# Patient Record
Sex: Female | Born: 1950 | Race: White | Hispanic: No | Marital: Married | Smoking: Former smoker
Health system: Southern US, Community
[De-identification: ages and names within clinical notes are randomized; demographics above are authoritative.]

## PROBLEM LIST (undated history)

## (undated) DIAGNOSIS — R519 Headache, unspecified: Secondary | ICD-10-CM

## (undated) DIAGNOSIS — E785 Hyperlipidemia, unspecified: Secondary | ICD-10-CM

## (undated) DIAGNOSIS — F419 Anxiety disorder, unspecified: Secondary | ICD-10-CM

## (undated) DIAGNOSIS — M1990 Unspecified osteoarthritis, unspecified site: Secondary | ICD-10-CM

## (undated) DIAGNOSIS — I10 Essential (primary) hypertension: Secondary | ICD-10-CM

## (undated) DIAGNOSIS — R0600 Dyspnea, unspecified: Secondary | ICD-10-CM

## (undated) DIAGNOSIS — J449 Chronic obstructive pulmonary disease, unspecified: Secondary | ICD-10-CM

## (undated) DIAGNOSIS — T8859XA Other complications of anesthesia, initial encounter: Secondary | ICD-10-CM

## (undated) DIAGNOSIS — K219 Gastro-esophageal reflux disease without esophagitis: Secondary | ICD-10-CM

## (undated) HISTORY — PX: DILATION AND CURETTAGE OF UTERUS: SHX78

---

## 2010-08-06 ENCOUNTER — Observation Stay (HOSPITAL_COMMUNITY)
Admission: EM | Admit: 2010-08-06 | Discharge: 2010-08-07 | Payer: Self-pay | Source: Home / Self Care | Admitting: Emergency Medicine

## 2010-11-17 LAB — BASIC METABOLIC PANEL
CO2: 24 mEq/L (ref 19–32)
Chloride: 107 mEq/L (ref 96–112)
Glucose, Bld: 128 mg/dL — ABNORMAL HIGH (ref 70–99)
Potassium: 3.8 mEq/L (ref 3.5–5.1)
Sodium: 140 mEq/L (ref 135–145)

## 2010-11-17 LAB — COMPREHENSIVE METABOLIC PANEL
Albumin: 3.9 g/dL (ref 3.5–5.2)
BUN: 17 mg/dL (ref 6–23)
CO2: 21 mEq/L (ref 19–32)
Chloride: 109 mEq/L (ref 96–112)
Creatinine, Ser: 1.15 mg/dL (ref 0.4–1.2)
GFR calc non Af Amer: 48 mL/min — ABNORMAL LOW (ref >60)
Total Bilirubin: 0.3 mg/dL (ref 0.3–1.2)

## 2010-11-17 LAB — LIPID PANEL
Triglycerides: 294 mg/dL — ABNORMAL HIGH (ref <150)
VLDL: 59 mg/dL — ABNORMAL HIGH (ref 0–40)

## 2010-11-17 LAB — DIFFERENTIAL
Basophils Absolute: 0 10*3/uL (ref 0.0–0.1)
Basophils Relative: 0 % (ref 0–1)
Monocytes Absolute: 0.9 10*3/uL (ref 0.1–1.0)
Monocytes Relative: 9 % (ref 3–12)
Neutro Abs: 6.9 10*3/uL (ref 1.7–7.7)

## 2010-11-17 LAB — CBC
HCT: 39.9 % (ref 36.0–46.0)
Hemoglobin: 13.9 g/dL (ref 12.0–15.0)
MCH: 33.4 pg (ref 26.0–34.0)
MCHC: 34.8 g/dL (ref 30.0–36.0)
MCV: 95.9 fL (ref 78.0–100.0)

## 2010-11-17 LAB — CARDIAC PANEL(CRET KIN+CKTOT+MB+TROPI)
CK, MB: 1.3 ng/mL (ref 0.3–4.0)
Relative Index: INVALID (ref 0.0–2.5)
Troponin I: <0.01 ng/mL (ref 0.00–0.06)
Troponin I: <0.01 ng/mL (ref 0.00–0.06)

## 2010-11-17 LAB — TSH: TSH: 0.512 u[IU]/mL (ref 0.350–4.500)

## 2012-07-25 DIAGNOSIS — M6530 Trigger finger, unspecified finger: Secondary | ICD-10-CM | POA: Insufficient documentation

## 2013-05-22 DIAGNOSIS — M722 Plantar fascial fibromatosis: Secondary | ICD-10-CM | POA: Insufficient documentation

## 2014-02-19 DIAGNOSIS — E7800 Pure hypercholesterolemia, unspecified: Secondary | ICD-10-CM | POA: Insufficient documentation

## 2014-03-01 DIAGNOSIS — M1990 Unspecified osteoarthritis, unspecified site: Secondary | ICD-10-CM | POA: Insufficient documentation

## 2014-03-01 DIAGNOSIS — R7309 Other abnormal glucose: Secondary | ICD-10-CM | POA: Insufficient documentation

## 2014-03-01 DIAGNOSIS — F321 Major depressive disorder, single episode, moderate: Secondary | ICD-10-CM | POA: Insufficient documentation

## 2014-04-17 DIAGNOSIS — M545 Low back pain, unspecified: Secondary | ICD-10-CM | POA: Insufficient documentation

## 2015-01-29 DIAGNOSIS — Z6831 Body mass index (BMI) 31.0-31.9, adult: Secondary | ICD-10-CM | POA: Diagnosis not present

## 2015-01-29 DIAGNOSIS — S80812A Abrasion, left lower leg, initial encounter: Secondary | ICD-10-CM | POA: Diagnosis not present

## 2015-02-28 DIAGNOSIS — R7309 Other abnormal glucose: Secondary | ICD-10-CM | POA: Diagnosis not present

## 2015-02-28 DIAGNOSIS — E780 Pure hypercholesterolemia: Secondary | ICD-10-CM | POA: Diagnosis not present

## 2015-02-28 DIAGNOSIS — F321 Major depressive disorder, single episode, moderate: Secondary | ICD-10-CM | POA: Diagnosis not present

## 2015-02-28 DIAGNOSIS — N189 Chronic kidney disease, unspecified: Secondary | ICD-10-CM | POA: Diagnosis not present

## 2015-03-12 DIAGNOSIS — F411 Generalized anxiety disorder: Secondary | ICD-10-CM | POA: Diagnosis not present

## 2015-03-12 DIAGNOSIS — N183 Chronic kidney disease, stage 3 (moderate): Secondary | ICD-10-CM | POA: Diagnosis not present

## 2015-03-12 DIAGNOSIS — E780 Pure hypercholesterolemia: Secondary | ICD-10-CM | POA: Diagnosis not present

## 2015-03-12 DIAGNOSIS — F321 Major depressive disorder, single episode, moderate: Secondary | ICD-10-CM | POA: Diagnosis not present

## 2015-03-12 DIAGNOSIS — Z6831 Body mass index (BMI) 31.0-31.9, adult: Secondary | ICD-10-CM | POA: Diagnosis not present

## 2015-03-12 DIAGNOSIS — M722 Plantar fascial fibromatosis: Secondary | ICD-10-CM | POA: Diagnosis not present

## 2015-03-12 DIAGNOSIS — M1990 Unspecified osteoarthritis, unspecified site: Secondary | ICD-10-CM | POA: Diagnosis not present

## 2015-03-12 DIAGNOSIS — R7309 Other abnormal glucose: Secondary | ICD-10-CM | POA: Diagnosis not present

## 2015-03-17 DIAGNOSIS — M7062 Trochanteric bursitis, left hip: Secondary | ICD-10-CM | POA: Insufficient documentation

## 2015-03-18 DIAGNOSIS — M7062 Trochanteric bursitis, left hip: Secondary | ICD-10-CM | POA: Diagnosis not present

## 2015-07-09 DIAGNOSIS — R7309 Other abnormal glucose: Secondary | ICD-10-CM | POA: Diagnosis not present

## 2015-07-09 DIAGNOSIS — E7800 Pure hypercholesterolemia, unspecified: Secondary | ICD-10-CM | POA: Diagnosis not present

## 2015-07-10 DIAGNOSIS — E7800 Pure hypercholesterolemia, unspecified: Secondary | ICD-10-CM | POA: Diagnosis not present

## 2015-07-16 DIAGNOSIS — Z23 Encounter for immunization: Secondary | ICD-10-CM | POA: Diagnosis not present

## 2015-07-16 DIAGNOSIS — M1990 Unspecified osteoarthritis, unspecified site: Secondary | ICD-10-CM | POA: Diagnosis not present

## 2015-07-16 DIAGNOSIS — R7309 Other abnormal glucose: Secondary | ICD-10-CM | POA: Diagnosis not present

## 2015-07-16 DIAGNOSIS — M722 Plantar fascial fibromatosis: Secondary | ICD-10-CM | POA: Diagnosis not present

## 2015-07-16 DIAGNOSIS — F321 Major depressive disorder, single episode, moderate: Secondary | ICD-10-CM | POA: Diagnosis not present

## 2015-07-16 DIAGNOSIS — N183 Chronic kidney disease, stage 3 (moderate): Secondary | ICD-10-CM | POA: Diagnosis not present

## 2015-07-16 DIAGNOSIS — E7800 Pure hypercholesterolemia, unspecified: Secondary | ICD-10-CM | POA: Diagnosis not present

## 2015-07-16 DIAGNOSIS — F411 Generalized anxiety disorder: Secondary | ICD-10-CM | POA: Diagnosis not present

## 2015-08-12 DIAGNOSIS — Z1231 Encounter for screening mammogram for malignant neoplasm of breast: Secondary | ICD-10-CM | POA: Diagnosis not present

## 2015-08-13 DIAGNOSIS — Z7982 Long term (current) use of aspirin: Secondary | ICD-10-CM | POA: Diagnosis not present

## 2015-08-13 DIAGNOSIS — I10 Essential (primary) hypertension: Secondary | ICD-10-CM | POA: Diagnosis not present

## 2015-08-13 DIAGNOSIS — F17200 Nicotine dependence, unspecified, uncomplicated: Secondary | ICD-10-CM | POA: Diagnosis not present

## 2015-08-13 DIAGNOSIS — F418 Other specified anxiety disorders: Secondary | ICD-10-CM | POA: Diagnosis not present

## 2015-08-13 DIAGNOSIS — M1990 Unspecified osteoarthritis, unspecified site: Secondary | ICD-10-CM | POA: Diagnosis not present

## 2015-08-13 DIAGNOSIS — Z79899 Other long term (current) drug therapy: Secondary | ICD-10-CM | POA: Diagnosis not present

## 2015-08-13 DIAGNOSIS — Z13820 Encounter for screening for osteoporosis: Secondary | ICD-10-CM | POA: Diagnosis not present

## 2015-08-13 DIAGNOSIS — M8580 Other specified disorders of bone density and structure, unspecified site: Secondary | ICD-10-CM | POA: Diagnosis not present

## 2015-08-13 DIAGNOSIS — E7800 Pure hypercholesterolemia, unspecified: Secondary | ICD-10-CM | POA: Diagnosis not present

## 2015-08-13 DIAGNOSIS — Z780 Asymptomatic menopausal state: Secondary | ICD-10-CM | POA: Diagnosis not present

## 2015-11-05 DIAGNOSIS — E7800 Pure hypercholesterolemia, unspecified: Secondary | ICD-10-CM | POA: Diagnosis not present

## 2015-11-05 DIAGNOSIS — N183 Chronic kidney disease, stage 3 (moderate): Secondary | ICD-10-CM | POA: Diagnosis not present

## 2015-11-05 DIAGNOSIS — F321 Major depressive disorder, single episode, moderate: Secondary | ICD-10-CM | POA: Diagnosis not present

## 2015-11-05 DIAGNOSIS — R7309 Other abnormal glucose: Secondary | ICD-10-CM | POA: Diagnosis not present

## 2015-11-12 DIAGNOSIS — E7800 Pure hypercholesterolemia, unspecified: Secondary | ICD-10-CM | POA: Diagnosis not present

## 2015-11-12 DIAGNOSIS — Z6831 Body mass index (BMI) 31.0-31.9, adult: Secondary | ICD-10-CM | POA: Diagnosis not present

## 2015-11-12 DIAGNOSIS — R7309 Other abnormal glucose: Secondary | ICD-10-CM | POA: Diagnosis not present

## 2015-11-12 DIAGNOSIS — F0390 Unspecified dementia without behavioral disturbance: Secondary | ICD-10-CM | POA: Diagnosis not present

## 2015-11-12 DIAGNOSIS — N183 Chronic kidney disease, stage 3 (moderate): Secondary | ICD-10-CM | POA: Diagnosis not present

## 2015-11-12 DIAGNOSIS — F321 Major depressive disorder, single episode, moderate: Secondary | ICD-10-CM | POA: Diagnosis not present

## 2015-11-12 DIAGNOSIS — M1990 Unspecified osteoarthritis, unspecified site: Secondary | ICD-10-CM | POA: Diagnosis not present

## 2015-11-12 DIAGNOSIS — F411 Generalized anxiety disorder: Secondary | ICD-10-CM | POA: Diagnosis not present

## 2015-11-12 DIAGNOSIS — M722 Plantar fascial fibromatosis: Secondary | ICD-10-CM | POA: Diagnosis not present

## 2016-03-23 DIAGNOSIS — R7309 Other abnormal glucose: Secondary | ICD-10-CM | POA: Diagnosis not present

## 2016-03-23 DIAGNOSIS — E7800 Pure hypercholesterolemia, unspecified: Secondary | ICD-10-CM | POA: Diagnosis not present

## 2016-03-23 DIAGNOSIS — N183 Chronic kidney disease, stage 3 (moderate): Secondary | ICD-10-CM | POA: Diagnosis not present

## 2016-03-23 DIAGNOSIS — M1990 Unspecified osteoarthritis, unspecified site: Secondary | ICD-10-CM | POA: Diagnosis not present

## 2016-03-26 DIAGNOSIS — R7309 Other abnormal glucose: Secondary | ICD-10-CM | POA: Diagnosis not present

## 2016-03-26 DIAGNOSIS — M722 Plantar fascial fibromatosis: Secondary | ICD-10-CM | POA: Diagnosis not present

## 2016-03-26 DIAGNOSIS — Z6831 Body mass index (BMI) 31.0-31.9, adult: Secondary | ICD-10-CM | POA: Diagnosis not present

## 2016-03-26 DIAGNOSIS — F411 Generalized anxiety disorder: Secondary | ICD-10-CM | POA: Diagnosis not present

## 2016-03-26 DIAGNOSIS — F321 Major depressive disorder, single episode, moderate: Secondary | ICD-10-CM | POA: Diagnosis not present

## 2016-03-26 DIAGNOSIS — M1990 Unspecified osteoarthritis, unspecified site: Secondary | ICD-10-CM | POA: Diagnosis not present

## 2016-03-26 DIAGNOSIS — Z13220 Encounter for screening for lipoid disorders: Secondary | ICD-10-CM | POA: Diagnosis not present

## 2016-03-26 DIAGNOSIS — Z6832 Body mass index (BMI) 32.0-32.9, adult: Secondary | ICD-10-CM | POA: Diagnosis not present

## 2016-04-29 ENCOUNTER — Emergency Department (HOSPITAL_COMMUNITY): Payer: Commercial Managed Care - HMO

## 2016-04-29 ENCOUNTER — Emergency Department (HOSPITAL_COMMUNITY)
Admission: EM | Admit: 2016-04-29 | Discharge: 2016-04-29 | Disposition: A | Discharge status: Home / Self Care | Payer: Commercial Managed Care - HMO | Attending: Emergency Medicine | Admitting: Emergency Medicine

## 2016-04-29 ENCOUNTER — Encounter (HOSPITAL_COMMUNITY): Payer: Self-pay | Admitting: *Deleted

## 2016-04-29 DIAGNOSIS — Y999 Unspecified external cause status: Secondary | ICD-10-CM | POA: Diagnosis not present

## 2016-04-29 DIAGNOSIS — Z79899 Other long term (current) drug therapy: Secondary | ICD-10-CM | POA: Diagnosis not present

## 2016-04-29 DIAGNOSIS — W298XXA Contact with other powered powered hand tools and household machinery, initial encounter: Secondary | ICD-10-CM | POA: Insufficient documentation

## 2016-04-29 DIAGNOSIS — Y93E5 Activity, floor mopping and cleaning: Secondary | ICD-10-CM | POA: Diagnosis not present

## 2016-04-29 DIAGNOSIS — S46912A Strain of unspecified muscle, fascia and tendon at shoulder and upper arm level, left arm, initial encounter: Secondary | ICD-10-CM | POA: Diagnosis not present

## 2016-04-29 DIAGNOSIS — I10 Essential (primary) hypertension: Secondary | ICD-10-CM | POA: Diagnosis not present

## 2016-04-29 DIAGNOSIS — Y9289 Other specified places as the place of occurrence of the external cause: Secondary | ICD-10-CM | POA: Insufficient documentation

## 2016-04-29 DIAGNOSIS — M25512 Pain in left shoulder: Secondary | ICD-10-CM | POA: Diagnosis not present

## 2016-04-29 DIAGNOSIS — F17210 Nicotine dependence, cigarettes, uncomplicated: Secondary | ICD-10-CM | POA: Insufficient documentation

## 2016-04-29 HISTORY — DX: Hyperlipidemia, unspecified: E78.5

## 2016-04-29 HISTORY — DX: Essential (primary) hypertension: I10

## 2016-04-29 HISTORY — DX: Unspecified osteoarthritis, unspecified site: M19.90

## 2016-04-29 MED ORDER — OXYCODONE-ACETAMINOPHEN 5-325 MG PO TABS
1.0000 | ORAL_TABLET | Freq: Once | ORAL | Status: AC
  Administered 2016-04-29: 1 via ORAL
  Filled 2016-04-29: qty 1

## 2016-04-29 MED ORDER — DICLOFENAC SODIUM 75 MG PO TBEC: 75.0000 mg | DELAYED_RELEASE_TABLET | Freq: Two times a day (BID) | ORAL | 0 refills | Status: DC

## 2016-04-29 NOTE — ED Provider Notes (Signed)
[facility]-EMERGENCY DEPT Provider Note   CSN: [id number] Arrival date & time: 04/29/16  0846     History   Chief Complaint Chief Complaint  Patient presents with  . Shoulder Pain    HPI Suzanne Blake is a 65 y.o. female.  HPI   Suzanne Blake is a 65 y.o. female who presents to the Emergency Department complaining of left shoulder pain for 6 days. She states the pain has been gradually increasing and onset was after using a carpet shampooer.  She describes a sharp, constant pain in the back of her shoulder that is worse with movement.  She normally takes Tramadol daily for OA and states the medication is not helping.  She also has intermittent pain radiating into her left arm to the level of her elbow.  She denies swelling, discoloration, numbness, chest pain or shortness of breath.     Past Medical History:  Diagnosis Date  . Arthritis   . Hyperlipidemia   . Hypertension     There are no active problems to display for this patient.   Past Surgical History:  Procedure Laterality Date  . DILATION AND CURETTAGE OF UTERUS      OB History    No data available       Home Medications    Prior to Admission medications   Not on File    Family History No family history on file.  Social History Social History  Substance Use Topics  . Smoking status: Current Every Day Smoker    Packs/day: 1.00    Types: Cigarettes  . Smokeless tobacco: Never Used  . Alcohol use No     Allergies   Lisinopril   Review of Systems Review of Systems  Constitutional: Negative for chills and fever.  Respiratory: Negative for shortness of breath.   Cardiovascular: Negative for chest pain.  Gastrointestinal: Negative for nausea and vomiting.  Musculoskeletal: Positive for arthralgias (Left shoulder pain). Negative for joint swelling, neck pain and neck stiffness.  Skin: Negative for color change and wound.  Neurological: Negative for weakness and numbness.  All other systems reviewed  and are negative.    Physical Exam Updated Vital Signs BP (!) 189/102 (BP Location: Right Arm) Comment: taken twice for validation  Pulse 66   Temp 98.7 F (37.1 C) (Oral)   Resp 16   SpO2 100%   Physical Exam  Constitutional: She is oriented to person, place, and time. She appears well-developed and well-nourished. No distress.  HENT:  Head: Normocephalic and atraumatic.  Neck: Normal range of motion. Neck supple. No thyromegaly present.  Cardiovascular: Normal rate, regular rhythm and intact distal pulses.   No murmur heard. Pulmonary/Chest: Effort normal and breath sounds normal. No respiratory distress. She exhibits no tenderness.  Musculoskeletal: She exhibits tenderness. She exhibits no edema.  Tender to palpation along the left scapular border, and left trapezius muscles.  Radial pulse is brisk, distal sensation intact, CR< 2 sec. Grip strength is strong and symmetrical.   No abrasions, edema , erythema or step-off deformity of the joint.   Lymphadenopathy:    She has no cervical adenopathy.  Neurological: She is alert and oriented to person, place, and time. She has normal strength. No sensory deficit. She exhibits normal muscle tone. Coordination normal.  Reflex Scores:      Tricep reflexes are 1+ on the right side and 2+ on the left side.      Bicep reflexes are 1+ on the right side and 2+ on the  left side. Skin: Skin is warm and dry.  Nursing note and vitals reviewed.    ED Treatments / Results  Labs (all labs ordered are listed, but only abnormal results are displayed) Labs Reviewed - No data to display  EKG  EKG Interpretation None       Radiology Dg Shoulder Left  Result Date: 04/29/2016 CLINICAL DATA:  Left shoulder pain starting Saturday after cleaning carpets EXAM: LEFT SHOULDER - 2+ VIEW COMPARISON:  None. FINDINGS: Three views of the left shoulder submitted. Minimal inferior spurring of left glenoid. Moderate degenerative changes AC joint. No acute  fracture or subluxation. IMPRESSION: No acute fracture or subluxation.  Degenerative changes. Electronically Signed   By: Natasha MeadLiviu  Pop M.D.   On: 04/29/2016 09:57    Procedures Procedures (including critical care time)  Medications Ordered in ED Medications  oxyCODONE-acetaminophen (PERCOCET/ROXICET) 5-325 MG per tablet 1 tablet (not administered)     Initial Impression / Assessment and Plan / ED Course  I have reviewed the triage vital signs and the nursing notes.  Pertinent labs & imaging results that were available during my care of the patient were reviewed by me and considered in my medical decision making (see chart for details).  Clinical Course   X-rays negative for fracture, patient has full range of motion of the joint and pain reproduced with abduction. Neurovascularly intact. Symptoms are likely musculoskeletal. Doubt cardiac process. Patient agrees to symptomatic treatment and orthopedic follow-up if not improving.  States she has taken diclofenac previously with improvement of previous joint pains.  I have advised very limited use and to take with food.  She verbalized understanding and agrees to plan   Final Clinical Impressions(s) / ED Diagnoses   Final diagnoses:  Left shoulder strain, initial encounter    New Prescriptions New Prescriptions   No medications on file     Rosey Bathammy Veda Arrellano, PA-C 04/29/16 1107    Rolland PorterMark James, MD 05/04/16 1715

## 2016-04-29 NOTE — Discharge Instructions (Signed)
Alternate ice and heat to your shoulder.  Follow-up with our primary doctor or contact the orthopedic doctor listed to arrange a follow-up appt.  Be sure to take the diclofenac with food and only take it for few days.

## 2016-04-29 NOTE — ED Notes (Signed)
PA at bedside.

## 2016-04-29 NOTE — ED Triage Notes (Signed)
Pt comes in for left shoulder pain. This started last Friday after she cleaned her carpet. Pt states pain got worse at 3am today. She states the pain is in her left shoulder, left back, and slightly into her back.  Pain is worse with movement, denies any chest pain.

## 2016-07-27 DIAGNOSIS — R7309 Other abnormal glucose: Secondary | ICD-10-CM | POA: Diagnosis not present

## 2016-07-27 DIAGNOSIS — N183 Chronic kidney disease, stage 3 (moderate): Secondary | ICD-10-CM | POA: Diagnosis not present

## 2016-07-27 DIAGNOSIS — E7800 Pure hypercholesterolemia, unspecified: Secondary | ICD-10-CM | POA: Diagnosis not present

## 2016-07-30 DIAGNOSIS — Z23 Encounter for immunization: Secondary | ICD-10-CM | POA: Diagnosis not present

## 2016-07-30 DIAGNOSIS — F411 Generalized anxiety disorder: Secondary | ICD-10-CM | POA: Diagnosis not present

## 2016-07-30 DIAGNOSIS — M722 Plantar fascial fibromatosis: Secondary | ICD-10-CM | POA: Diagnosis not present

## 2016-07-30 DIAGNOSIS — E7800 Pure hypercholesterolemia, unspecified: Secondary | ICD-10-CM | POA: Diagnosis not present

## 2016-07-30 DIAGNOSIS — M1990 Unspecified osteoarthritis, unspecified site: Secondary | ICD-10-CM | POA: Diagnosis not present

## 2016-07-30 DIAGNOSIS — N183 Chronic kidney disease, stage 3 (moderate): Secondary | ICD-10-CM | POA: Diagnosis not present

## 2016-07-30 DIAGNOSIS — F321 Major depressive disorder, single episode, moderate: Secondary | ICD-10-CM | POA: Diagnosis not present

## 2016-07-30 DIAGNOSIS — R7309 Other abnormal glucose: Secondary | ICD-10-CM | POA: Diagnosis not present

## 2016-08-13 DIAGNOSIS — R51 Headache: Secondary | ICD-10-CM | POA: Diagnosis not present

## 2016-08-13 DIAGNOSIS — Z8489 Family history of other specified conditions: Secondary | ICD-10-CM | POA: Diagnosis not present

## 2016-08-13 DIAGNOSIS — R930 Abnormal findings on diagnostic imaging of skull and head, not elsewhere classified: Secondary | ICD-10-CM | POA: Diagnosis not present

## 2016-08-13 DIAGNOSIS — R413 Other amnesia: Secondary | ICD-10-CM | POA: Diagnosis not present

## 2016-11-22 DIAGNOSIS — F321 Major depressive disorder, single episode, moderate: Secondary | ICD-10-CM | POA: Diagnosis not present

## 2016-11-22 DIAGNOSIS — E7800 Pure hypercholesterolemia, unspecified: Secondary | ICD-10-CM | POA: Diagnosis not present

## 2016-11-22 DIAGNOSIS — N183 Chronic kidney disease, stage 3 (moderate): Secondary | ICD-10-CM | POA: Diagnosis not present

## 2016-11-22 DIAGNOSIS — R7309 Other abnormal glucose: Secondary | ICD-10-CM | POA: Diagnosis not present

## 2016-11-22 DIAGNOSIS — F0390 Unspecified dementia without behavioral disturbance: Secondary | ICD-10-CM | POA: Diagnosis not present

## 2016-11-22 DIAGNOSIS — F411 Generalized anxiety disorder: Secondary | ICD-10-CM | POA: Diagnosis not present

## 2016-11-22 DIAGNOSIS — N189 Chronic kidney disease, unspecified: Secondary | ICD-10-CM | POA: Diagnosis not present

## 2016-11-22 DIAGNOSIS — M1990 Unspecified osteoarthritis, unspecified site: Secondary | ICD-10-CM | POA: Diagnosis not present

## 2016-11-23 DIAGNOSIS — F03A0 Unspecified dementia, mild, without behavioral disturbance, psychotic disturbance, mood disturbance, and anxiety: Secondary | ICD-10-CM | POA: Insufficient documentation

## 2016-11-23 DIAGNOSIS — N183 Chronic kidney disease, stage 3 unspecified: Secondary | ICD-10-CM | POA: Insufficient documentation

## 2016-11-24 DIAGNOSIS — M1990 Unspecified osteoarthritis, unspecified site: Secondary | ICD-10-CM | POA: Diagnosis not present

## 2016-11-24 DIAGNOSIS — N183 Chronic kidney disease, stage 3 (moderate): Secondary | ICD-10-CM | POA: Diagnosis not present

## 2016-11-24 DIAGNOSIS — Z1389 Encounter for screening for other disorder: Secondary | ICD-10-CM | POA: Diagnosis not present

## 2016-11-24 DIAGNOSIS — E7800 Pure hypercholesterolemia, unspecified: Secondary | ICD-10-CM | POA: Diagnosis not present

## 2016-11-24 DIAGNOSIS — M722 Plantar fascial fibromatosis: Secondary | ICD-10-CM | POA: Diagnosis not present

## 2016-11-24 DIAGNOSIS — R7309 Other abnormal glucose: Secondary | ICD-10-CM | POA: Diagnosis not present

## 2016-11-24 DIAGNOSIS — F411 Generalized anxiety disorder: Secondary | ICD-10-CM | POA: Diagnosis not present

## 2016-11-24 DIAGNOSIS — F321 Major depressive disorder, single episode, moderate: Secondary | ICD-10-CM | POA: Diagnosis not present

## 2016-11-30 DIAGNOSIS — Z1231 Encounter for screening mammogram for malignant neoplasm of breast: Secondary | ICD-10-CM | POA: Diagnosis not present

## 2017-03-21 DIAGNOSIS — E7800 Pure hypercholesterolemia, unspecified: Secondary | ICD-10-CM | POA: Diagnosis not present

## 2017-03-21 DIAGNOSIS — F321 Major depressive disorder, single episode, moderate: Secondary | ICD-10-CM | POA: Diagnosis not present

## 2017-03-21 DIAGNOSIS — Z720 Tobacco use: Secondary | ICD-10-CM | POA: Diagnosis not present

## 2017-03-21 DIAGNOSIS — N183 Chronic kidney disease, stage 3 (moderate): Secondary | ICD-10-CM | POA: Diagnosis not present

## 2017-03-21 DIAGNOSIS — Z6831 Body mass index (BMI) 31.0-31.9, adult: Secondary | ICD-10-CM | POA: Diagnosis not present

## 2017-03-21 DIAGNOSIS — R739 Hyperglycemia, unspecified: Secondary | ICD-10-CM | POA: Diagnosis not present

## 2017-03-21 DIAGNOSIS — Z13220 Encounter for screening for lipoid disorders: Secondary | ICD-10-CM | POA: Diagnosis not present

## 2017-03-21 DIAGNOSIS — Z0000 Encounter for general adult medical examination without abnormal findings: Secondary | ICD-10-CM | POA: Diagnosis not present

## 2017-03-21 DIAGNOSIS — F0390 Unspecified dementia without behavioral disturbance: Secondary | ICD-10-CM | POA: Diagnosis not present

## 2017-03-23 DIAGNOSIS — E7800 Pure hypercholesterolemia, unspecified: Secondary | ICD-10-CM | POA: Diagnosis not present

## 2017-03-23 DIAGNOSIS — Z23 Encounter for immunization: Secondary | ICD-10-CM | POA: Diagnosis not present

## 2017-03-23 DIAGNOSIS — Z1212 Encounter for screening for malignant neoplasm of rectum: Secondary | ICD-10-CM | POA: Diagnosis not present

## 2017-03-23 DIAGNOSIS — R7309 Other abnormal glucose: Secondary | ICD-10-CM | POA: Diagnosis not present

## 2017-03-23 DIAGNOSIS — N183 Chronic kidney disease, stage 3 (moderate): Secondary | ICD-10-CM | POA: Diagnosis not present

## 2017-03-23 DIAGNOSIS — Z6831 Body mass index (BMI) 31.0-31.9, adult: Secondary | ICD-10-CM | POA: Diagnosis not present

## 2017-03-23 DIAGNOSIS — M722 Plantar fascial fibromatosis: Secondary | ICD-10-CM | POA: Diagnosis not present

## 2017-03-23 DIAGNOSIS — F0390 Unspecified dementia without behavioral disturbance: Secondary | ICD-10-CM | POA: Diagnosis not present

## 2017-07-22 DIAGNOSIS — R7309 Other abnormal glucose: Secondary | ICD-10-CM | POA: Diagnosis not present

## 2017-07-22 DIAGNOSIS — F321 Major depressive disorder, single episode, moderate: Secondary | ICD-10-CM | POA: Diagnosis not present

## 2017-07-22 DIAGNOSIS — M1990 Unspecified osteoarthritis, unspecified site: Secondary | ICD-10-CM | POA: Diagnosis not present

## 2017-07-22 DIAGNOSIS — N183 Chronic kidney disease, stage 3 (moderate): Secondary | ICD-10-CM | POA: Diagnosis not present

## 2017-07-22 DIAGNOSIS — E7800 Pure hypercholesterolemia, unspecified: Secondary | ICD-10-CM | POA: Diagnosis not present

## 2017-07-26 DIAGNOSIS — Z1212 Encounter for screening for malignant neoplasm of rectum: Secondary | ICD-10-CM | POA: Diagnosis not present

## 2017-07-26 DIAGNOSIS — Z23 Encounter for immunization: Secondary | ICD-10-CM | POA: Diagnosis not present

## 2017-07-26 DIAGNOSIS — F0390 Unspecified dementia without behavioral disturbance: Secondary | ICD-10-CM | POA: Diagnosis not present

## 2017-07-26 DIAGNOSIS — M1990 Unspecified osteoarthritis, unspecified site: Secondary | ICD-10-CM | POA: Diagnosis not present

## 2017-07-26 DIAGNOSIS — R7309 Other abnormal glucose: Secondary | ICD-10-CM | POA: Diagnosis not present

## 2017-07-26 DIAGNOSIS — N183 Chronic kidney disease, stage 3 (moderate): Secondary | ICD-10-CM | POA: Diagnosis not present

## 2017-07-26 DIAGNOSIS — F321 Major depressive disorder, single episode, moderate: Secondary | ICD-10-CM | POA: Diagnosis not present

## 2017-07-26 DIAGNOSIS — E7800 Pure hypercholesterolemia, unspecified: Secondary | ICD-10-CM | POA: Diagnosis not present

## 2017-07-26 DIAGNOSIS — F411 Generalized anxiety disorder: Secondary | ICD-10-CM | POA: Diagnosis not present

## 2017-07-26 DIAGNOSIS — M722 Plantar fascial fibromatosis: Secondary | ICD-10-CM | POA: Diagnosis not present

## 2017-07-26 DIAGNOSIS — Z6831 Body mass index (BMI) 31.0-31.9, adult: Secondary | ICD-10-CM | POA: Diagnosis not present

## 2017-09-04 IMAGING — DX DG SHOULDER 2+V*L*
3 series · 3 of 3 positions shown · non-contrast
Comparison: None.

CLINICAL DATA: Left shoulder pain starting [REDACTED] after cleaning
carpets

EXAM:
LEFT SHOULDER - 2+ VIEW

[shoulder grashey]
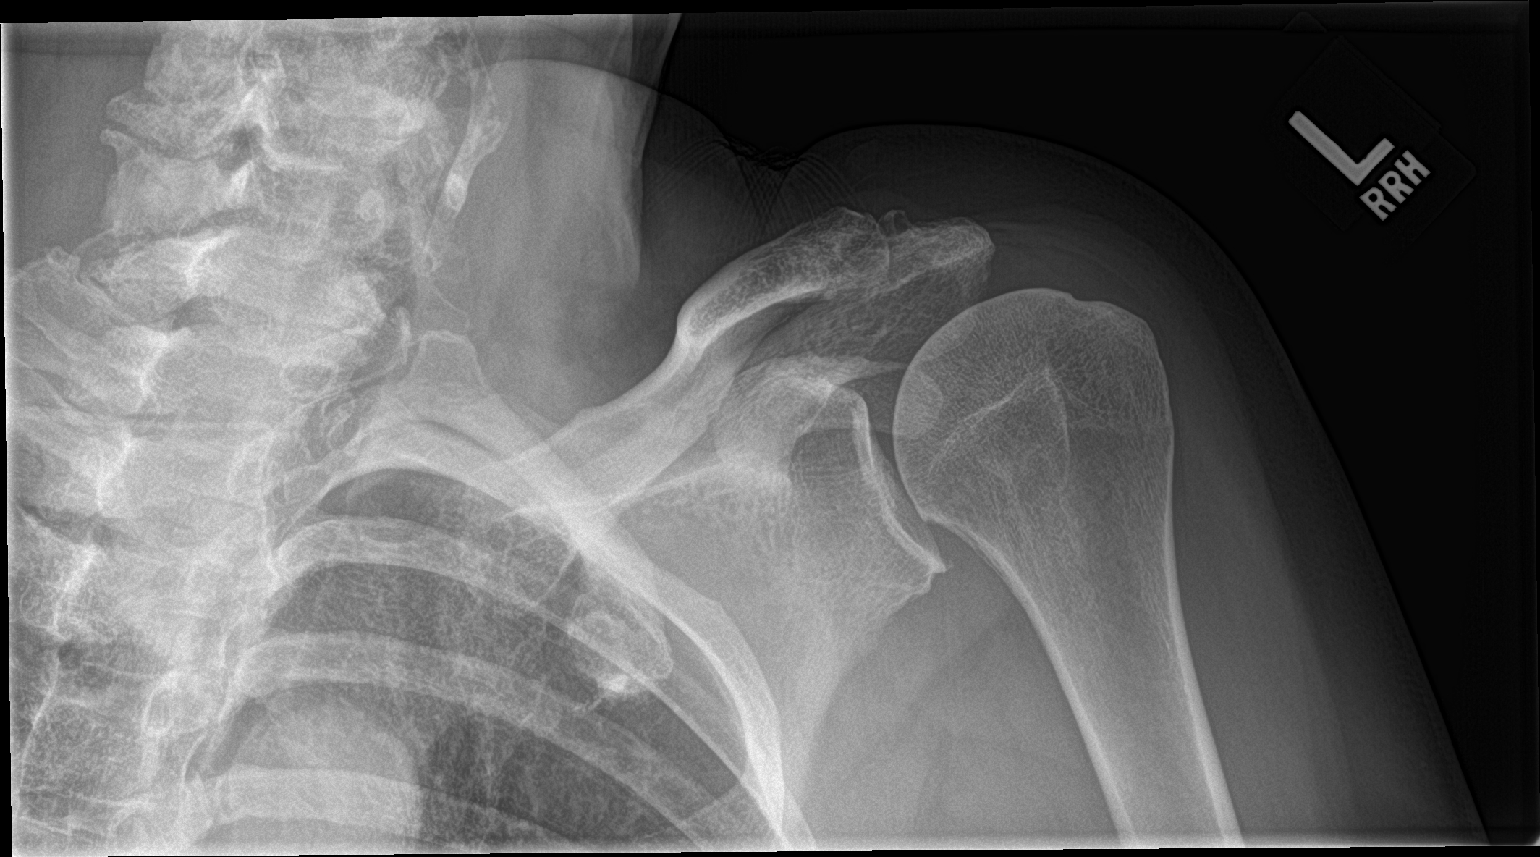

[shoulder y view]
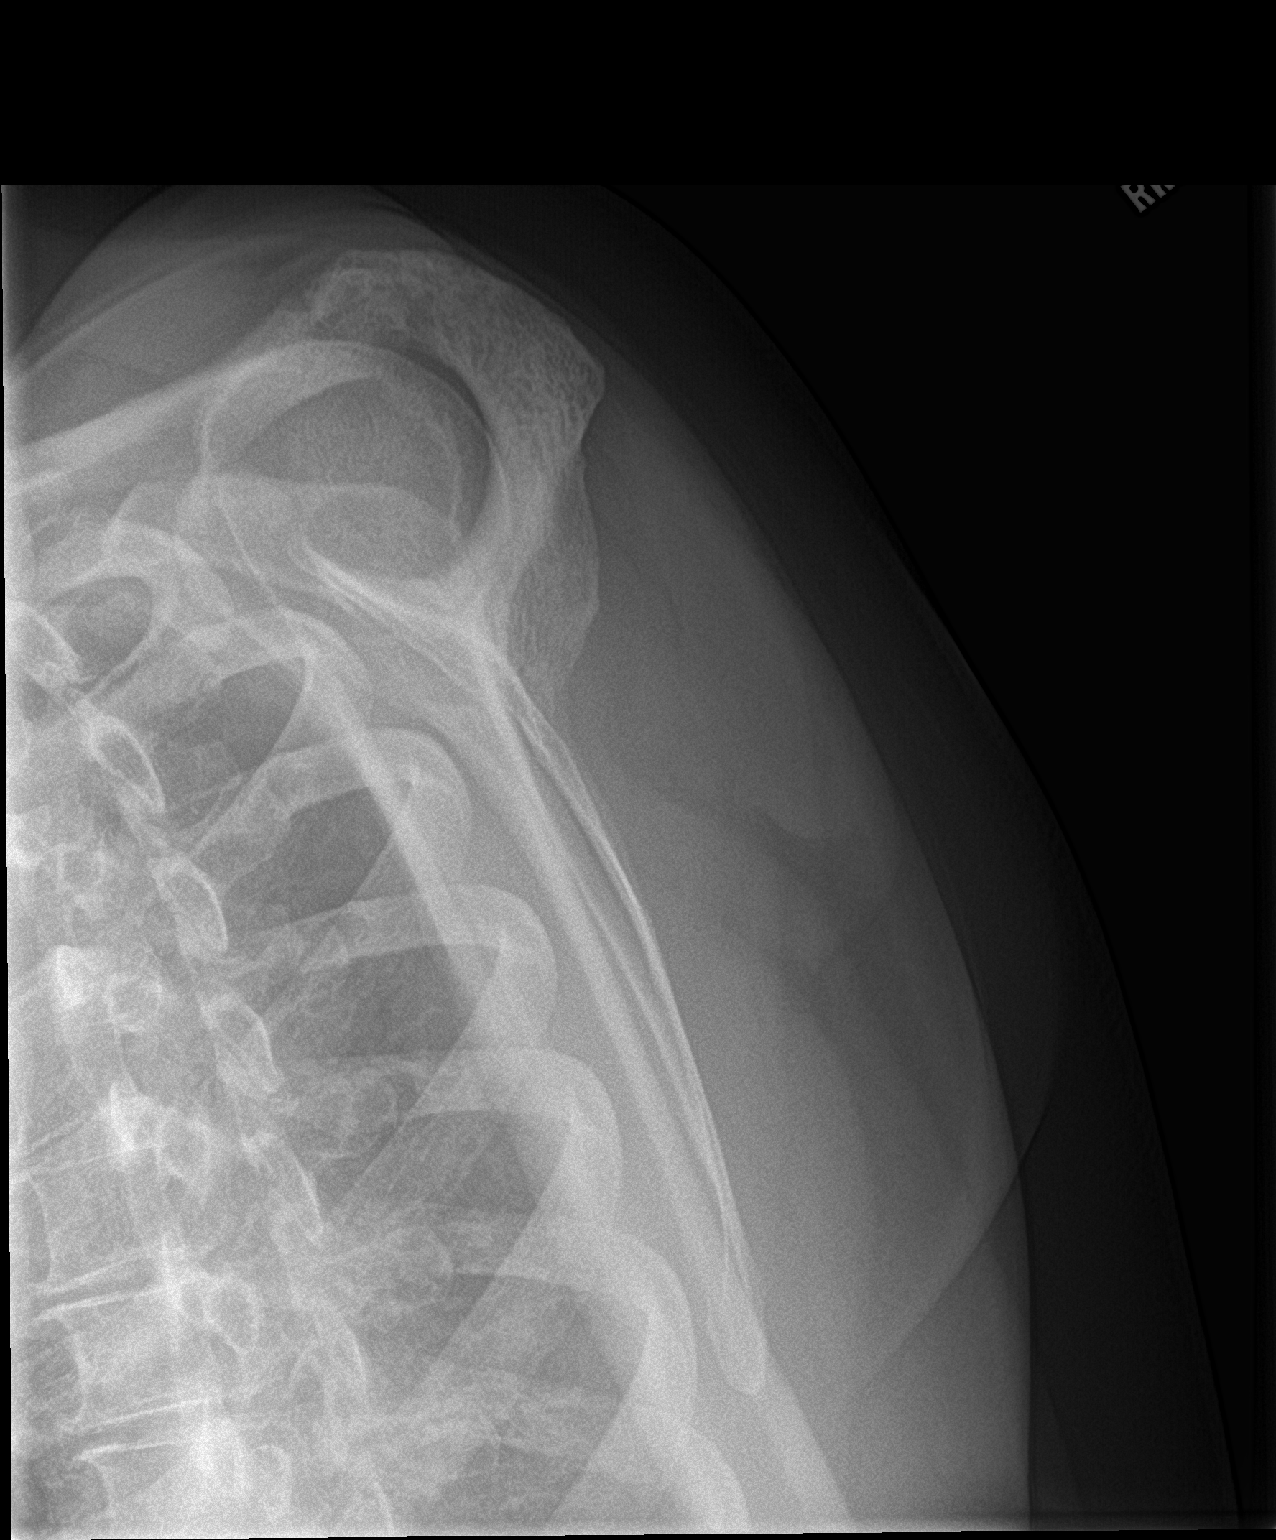

[shoulder axillary]
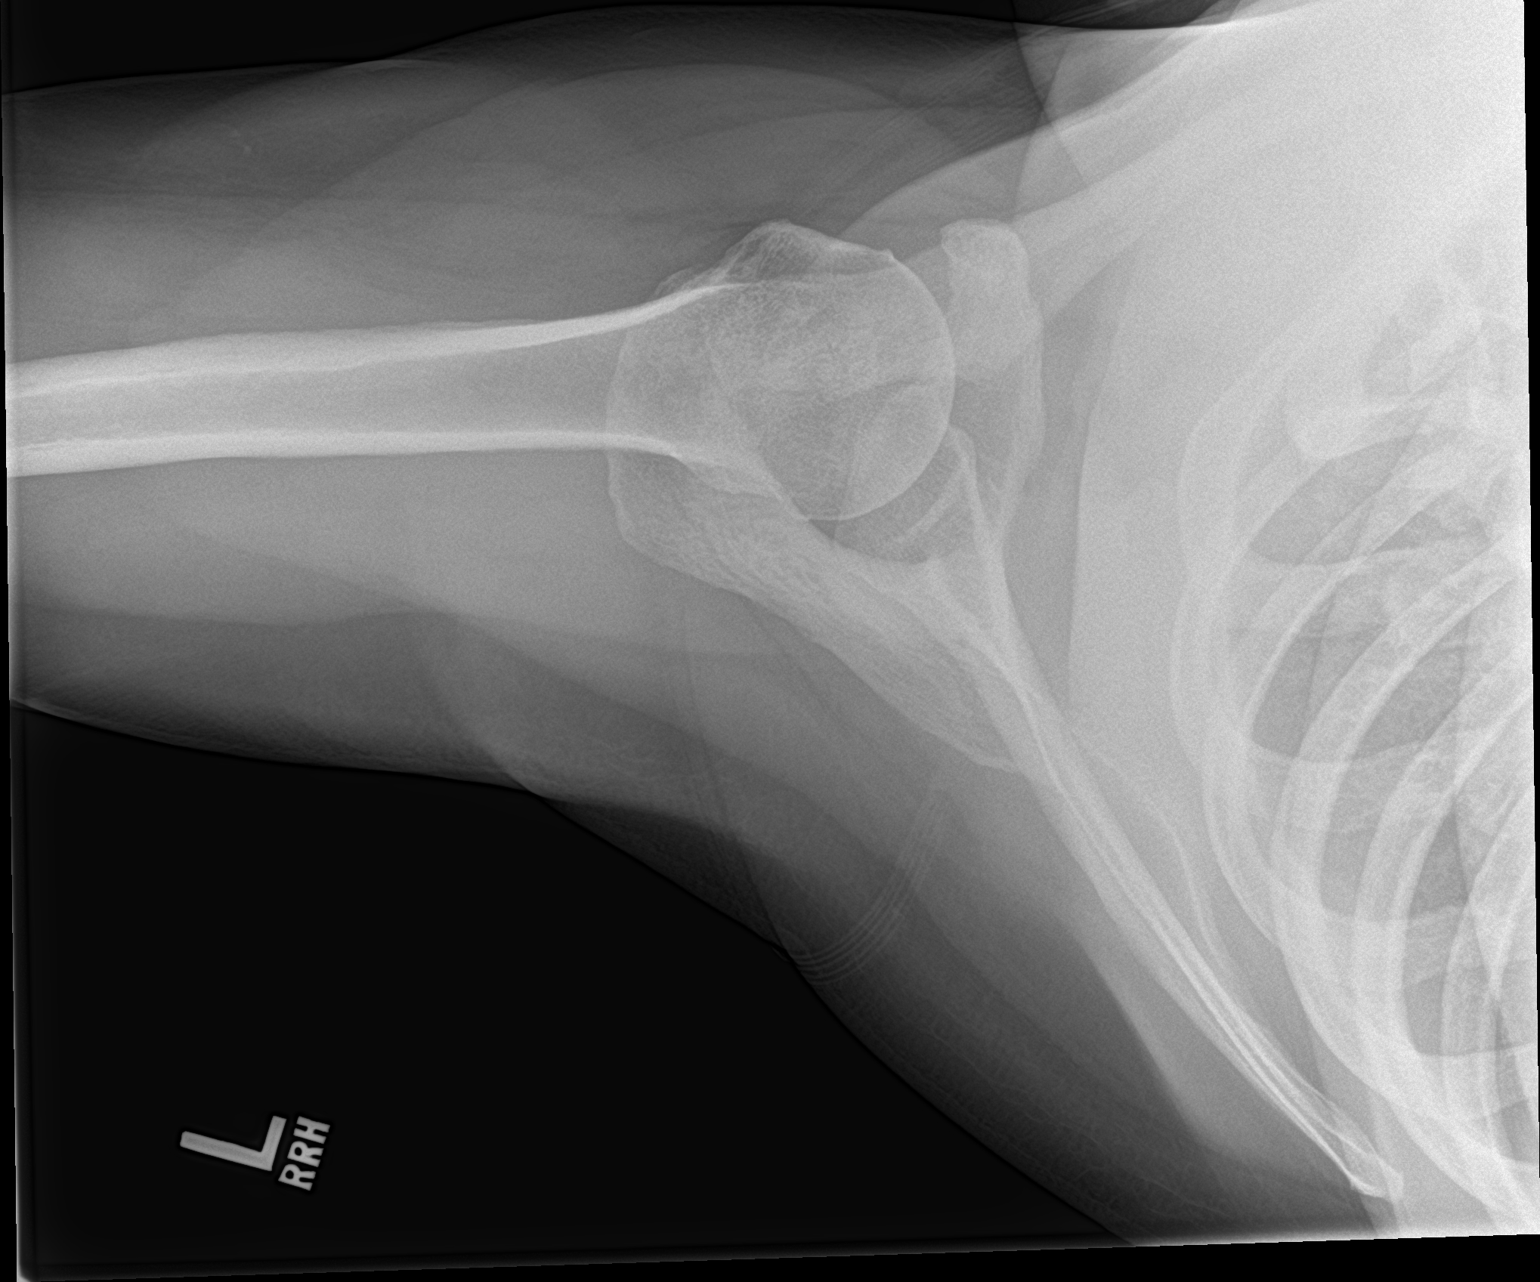

[3 of 3 positions shown; findings below may reference images not displayed]

FINDINGS: Three views of the left shoulder submitted. Minimal inferior
spurring of left glenoid. Moderate degenerative changes AC joint. No
acute fracture or subluxation.
IMPRESSION: No acute fracture or subluxation.  Degenerative changes.

## 2017-11-23 DIAGNOSIS — E782 Mixed hyperlipidemia: Secondary | ICD-10-CM | POA: Insufficient documentation

## 2018-03-10 DIAGNOSIS — R059 Cough, unspecified: Secondary | ICD-10-CM | POA: Insufficient documentation

## 2018-03-31 DIAGNOSIS — E875 Hyperkalemia: Secondary | ICD-10-CM | POA: Insufficient documentation

## 2018-09-21 DIAGNOSIS — J0101 Acute recurrent maxillary sinusitis: Secondary | ICD-10-CM | POA: Insufficient documentation

## 2019-01-09 ENCOUNTER — Encounter (HOSPITAL_COMMUNITY): Payer: Self-pay

## 2019-01-09 ENCOUNTER — Emergency Department (HOSPITAL_COMMUNITY): Payer: Medicare HMO

## 2019-01-09 ENCOUNTER — Other Ambulatory Visit: Payer: Self-pay

## 2019-01-09 ENCOUNTER — Emergency Department (HOSPITAL_COMMUNITY)
Admission: EM | Admit: 2019-01-09 | Discharge: 2019-01-09 | Disposition: A | Discharge status: Home / Self Care | Payer: Medicare HMO | Attending: Emergency Medicine | Admitting: Emergency Medicine

## 2019-01-09 DIAGNOSIS — S40811A Abrasion of right upper arm, initial encounter: Secondary | ICD-10-CM | POA: Insufficient documentation

## 2019-01-09 DIAGNOSIS — F17210 Nicotine dependence, cigarettes, uncomplicated: Secondary | ICD-10-CM | POA: Insufficient documentation

## 2019-01-09 DIAGNOSIS — Z79899 Other long term (current) drug therapy: Secondary | ICD-10-CM | POA: Diagnosis not present

## 2019-01-09 DIAGNOSIS — Y9389 Activity, other specified: Secondary | ICD-10-CM | POA: Diagnosis not present

## 2019-01-09 DIAGNOSIS — I10 Essential (primary) hypertension: Secondary | ICD-10-CM | POA: Diagnosis not present

## 2019-01-09 DIAGNOSIS — Y92009 Unspecified place in unspecified non-institutional (private) residence as the place of occurrence of the external cause: Secondary | ICD-10-CM | POA: Insufficient documentation

## 2019-01-09 DIAGNOSIS — S59911A Unspecified injury of right forearm, initial encounter: Secondary | ICD-10-CM | POA: Diagnosis present

## 2019-01-09 DIAGNOSIS — W07XXXA Fall from chair, initial encounter: Secondary | ICD-10-CM | POA: Diagnosis not present

## 2019-01-09 DIAGNOSIS — S5011XA Contusion of right forearm, initial encounter: Secondary | ICD-10-CM

## 2019-01-09 DIAGNOSIS — Y999 Unspecified external cause status: Secondary | ICD-10-CM | POA: Diagnosis not present

## 2019-01-09 DIAGNOSIS — W19XXXA Unspecified fall, initial encounter: Secondary | ICD-10-CM

## 2019-01-09 NOTE — Discharge Instructions (Addendum)
Use ice on the sore area 3-4 times a day for several days.  Elevate your right arm above your heart to help decrease the swelling.  Clean the abrasion of the right forearm with soap and water once or twice a day.  Use the Ace wrap, as needed for comfort.

## 2019-01-09 NOTE — ED Triage Notes (Signed)
Pt presents to ED following a fall at home. Pt unsure if she hit her head but denies LOC. Pt with laceration and bruising to right forearm. Pt with bruising to left leg. Pt takes baby ASA daily

## 2019-01-09 NOTE — ED Provider Notes (Addendum)
[facility] EMERGENCY DEPARTMENT Provider Note   CSN: [id number] Arrival date & time: 01/09/19  1556    History   Chief Complaint Chief Complaint  Patient presents with  . Fall    HPI Suzanne Blake is a 68 y.o. female.     HPI  She presents for evaluation of injury to her right arm when she slipped off a chair while hanging a picture.  Her feet were at chair height, and she landed on the chair as she fell.  She also feels like she impacted her right buttock.  She has not had pain in her neck, back, head, legs, or left arm.  She is [employer] and came here by private vehicle.  She thinks she had a tetanus booster within the last 10 years.  There are no other known modifying factors.   Past Medical History:  Diagnosis Date  . Arthritis   . Hyperlipidemia   . Hypertension     There are no active problems to display for this patient.   Past Surgical History:  Procedure Laterality Date  . DILATION AND CURETTAGE OF UTERUS       OB History   No obstetric history on file.      Home Medications    Prior to Admission medications   Medication Sig Start Date End Date Taking? Authorizing Provider  CALCIUM CITRATE-VITAMIN D PO Take 1 tablet by mouth daily.  03/13/16   [provider]  diclofenac (VOLTAREN) 75 MG EC tablet Take 1 tablet (75 mg total) by mouth 2 (two) times daily. Take with food 04/29/16   Triplett, Tammy, PA-C  donepezil (ARICEPT) 5 MG tablet Take 5 mg by mouth at bedtime.  04/08/16   [provider]  FLUoxetine (PROZAC) 20 MG capsule Take 40 mg by mouth daily.  03/27/16   [provider]  gabapentin (NEURONTIN) 300 MG capsule Take 300 mg by mouth 3 (three) times daily.  03/26/16   [provider]  losartan (COZAAR) 50 MG tablet Take 50 mg by mouth daily.  04/20/16   [provider]  rosuvastatin (CRESTOR) 20 MG tablet Take 20 mg by mouth daily.  03/27/16   [provider]  traMADol (ULTRAM) 50 MG tablet Take 50 mg by  mouth 3 (three) times daily.  04/27/16   [provider]    Family History No family history on file.  Social History Social History   Tobacco Use  . Smoking status: Former Smoker    Packs/day: 1.00    Types: Cigarettes  . Smokeless tobacco: Never Used  Substance Use Topics  . Alcohol use: No  . Drug use: No     Allergies   Lisinopril   Review of Systems Review of Systems  All other systems reviewed and are negative.    Physical Exam Updated Vital Signs BP (!) 156/88 (BP Location: Left Arm)   Pulse 77   Temp 98.2 F (36.8 C) (Oral)   Resp 17   Ht 5\' 3"  (1.6 m)   Wt 81.6 kg   SpO2 96%   BMI 31.89 kg/m   Physical Exam Vitals signs and nursing note reviewed.  Constitutional:      General: She is not in acute distress.    Appearance: She is well-developed. She is obese. She is not ill-appearing, toxic-appearing or diaphoretic.  HENT:     Head: Normocephalic and atraumatic.     Right Ear: External ear normal.     Left Ear: External ear normal.  Eyes:     Conjunctiva/sclera: Conjunctivae normal.     Pupils: Pupils are equal, round, and reactive to light.  Neck:     Musculoskeletal: Normal range of motion and neck supple.     Trachea: Phonation normal.  Cardiovascular:     Rate and Rhythm: Normal rate and regular rhythm.     Heart sounds: Normal heart sounds.  Pulmonary:     Effort: Pulmonary effort is normal. No respiratory distress.     Breath sounds: Normal breath sounds. No wheezing or rhonchi.  Chest:     Chest wall: No tenderness.  Musculoskeletal: Normal range of motion.     Comments: Contusion with ecchymosis right forearm, dorsal, over the ulna.  This area of contusion is about 4 x 10 cm.  There is a superficial nonbleeding abrasion over the middle portion of the swollen area.  She has normal range of motion of the right arm including shoulder, elbow, wrist, hand.  Also of note pronation and supination of the right elbow is normal.  Skin:     General: Skin is warm and dry.  Neurological:     Mental Status: She is alert and oriented to person, place, and time.     Cranial Nerves: No cranial nerve deficit.     Sensory: No sensory deficit.     Motor: No abnormal muscle tone.     Coordination: Coordination normal.  Psychiatric:        Mood and Affect: Mood normal.        Behavior: Behavior normal.        Thought Content: Thought content normal.        Judgment: Judgment normal.      ED Treatments / Results  Labs (all labs ordered are listed, but only abnormal results are displayed) Labs Reviewed - No data to display  EKG None  Radiology No results found.  Procedures Procedures (including critical care time)  Medications Ordered in ED Medications - No data to display   Initial Impression / Assessment and Plan / ED Course  I have reviewed the triage vital signs and the nursing notes.  Pertinent labs & imaging results that were available during my care of the patient were reviewed by me and considered in my medical decision making (see chart for details).         Patient Vitals for the past 24 hrs:  BP Temp Temp src Pulse Resp SpO2 Height Weight  01/09/19 1606 - - - - - -  (1.6 m) 81.6 kg  01/09/19 1605 (!) 156/88 98.2 F (36.8 C) Oral 77 17 96 % - -    4:15 PM Reevaluation with update and discussion. After initial assessment and treatment, an updated evaluation reveals no change in clinical status.  Findings were discussed with the patient.  She is comfortable without imaging at this time.  All questions answered. Mancel Bale   Medical Decision Making: Fall with contusion right arm.  Doubt significant head injury, spine injury, long bone fracture or visceral injury.  CRITICAL CARE-no Performed by: Mancel Bale   Nursing Notes Reviewed/ Care Coordinated Applicable Imaging Reviewed Interpretation of Laboratory Data incorporated into ED treatment  The patient appears reasonably screened  and/or stabilized for discharge and I doubt any other medical condition or other [facility] requiring further screening, evaluation, or treatment in the ED at this time prior to discharge.  Plan: Home Medications-continue usual medications; Home Treatments-cryotherapy Ace wrap; return here if the recommended treatment, does not improve  the symptoms; Recommended follow up-PCP, PRN   Final Clinical Impressions(s) / ED Diagnoses   Final diagnoses:  Contusion of right forearm, initial encounter  Fall, initial encounter  Abrasion of right upper extremity, initial encounter    ED Discharge Orders    None       Mancel Bale, MD 01/09/19 1616    Mancel Bale, MD 01/09/19 1619   After initial discharge, the patient's daughter contacted the patient on the phone and told her and the patient's nurse that she wanted the patient to have a x-ray of the affected area of her right arm prior to discharge.  X-ray right forearm ordered by me.   Mancel Bale, MD 01/09/19 [id number]

## 2019-06-07 ENCOUNTER — Other Ambulatory Visit: Payer: Self-pay | Admitting: *Deleted

## 2019-06-07 DIAGNOSIS — Z20822 Contact with and (suspected) exposure to covid-19: Secondary | ICD-10-CM

## 2019-06-08 LAB — NOVEL CORONAVIRUS, NAA: SARS-CoV-2, NAA: NOT DETECTED

## 2019-10-31 DIAGNOSIS — R1310 Dysphagia, unspecified: Secondary | ICD-10-CM | POA: Insufficient documentation

## 2019-10-31 DIAGNOSIS — M2550 Pain in unspecified joint: Secondary | ICD-10-CM | POA: Insufficient documentation

## 2019-11-20 ENCOUNTER — Encounter: Payer: Self-pay | Admitting: *Deleted

## 2019-11-21 ENCOUNTER — Other Ambulatory Visit: Payer: Self-pay

## 2019-11-21 ENCOUNTER — Ambulatory Visit (INDEPENDENT_AMBULATORY_CARE_PROVIDER_SITE_OTHER): Payer: Medicare HMO | Admitting: Cardiology

## 2019-11-21 ENCOUNTER — Telehealth: Payer: Self-pay | Admitting: Cardiology

## 2019-11-21 ENCOUNTER — Encounter: Payer: Self-pay | Admitting: *Deleted

## 2019-11-21 VITALS — BP 130/80 | HR 70 | Ht 63.0 in | Wt 189.2 lb

## 2019-11-21 DIAGNOSIS — R079 Chest pain, unspecified: Secondary | ICD-10-CM | POA: Diagnosis not present

## 2019-11-21 NOTE — Progress Notes (Signed)
Clinical Summary Suzanne Blake is a 69 y.o.female seen as new consult, referred by Dr Nadara Mustard for the following medical problems.   1. Chest pain - midchest, sharp pain. Can occur with with mild activity or at rest. <5/10 in severity. Lasts just a few seconds. No other associated symptoms. Can worst be worst with deep breathing. Occurs 1-2 times per month - highest level of activity is housework. Chronic SOB unchanged.  -reports prior fall with right shoulder pains.   CAD risk factors: HTN, HL, former tobacco x 20 years.  - cannot run on treadmill due to balance issues       Past Medical History:  Diagnosis Date  . Arthritis   . Hyperlipidemia   . Hypertension      Allergies  Allergen Reactions  . Lisinopril Anaphylaxis     Current Outpatient Medications  Medication Sig Dispense Refill  . CALCIUM CITRATE-VITAMIN D PO Take 1 tablet by mouth daily.     . diclofenac (VOLTAREN) 75 MG EC tablet Take 1 tablet (75 mg total) by mouth 2 (two) times daily. Take with food 10 tablet 0  . donepezil (ARICEPT) 5 MG tablet Take 5 mg by mouth at bedtime.     Marland Kitchen FLUoxetine (PROZAC) 20 MG capsule Take 40 mg by mouth daily.     Marland Kitchen gabapentin (NEURONTIN) 300 MG capsule Take 300 mg by mouth 3 (three) times daily.     Marland Kitchen losartan (COZAAR) 50 MG tablet Take 50 mg by mouth daily.     . rosuvastatin (CRESTOR) 20 MG tablet Take 20 mg by mouth daily.     . traMADol (ULTRAM) 50 MG tablet Take 50 mg by mouth 3 (three) times daily.      No current facility-administered medications for this visit.     Past Surgical History:  Procedure Laterality Date  . DILATION AND CURETTAGE OF UTERUS       Allergies  Allergen Reactions  . Lisinopril Anaphylaxis      No family history on file.   Social History Suzanne Blake reports that she has quit smoking. Her smoking use included cigarettes. She smoked 1.00 pack per day. She has never used smokeless tobacco. Suzanne Blake reports no history of alcohol  use.   Review of Systems CONSTITUTIONAL: No weight loss, fever, chills, weakness or fatigue.  HEENT: Eyes: No visual loss, blurred vision, double vision or yellow sclerae.No hearing loss, sneezing, congestion, runny nose or sore throat.  SKIN: No rash or itching.  CARDIOVASCULAR: per hpi RESPIRATORY: No shortness of breath, cough or sputum.  GASTROINTESTINAL: No anorexia, nausea, vomiting or diarrhea. No abdominal pain or blood.  GENITOURINARY: No burning on urination, no polyuria NEUROLOGICAL: No headache, dizziness, syncope, paralysis, ataxia, numbness or tingling in the extremities. No change in bowel or bladder control.  MUSCULOSKELETAL: No muscle, back pain, joint pain or stiffness.  LYMPHATICS: No enlarged nodes. No history of splenectomy.  PSYCHIATRIC: No history of depression or anxiety.  ENDOCRINOLOGIC: No reports of sweating, cold or heat intolerance. No polyuria or polydipsia.  Marland Kitchen   Physical Examination Today's Vitals   11/21/19 1457  BP: 130/80  Pulse: 70  SpO2: 97%  Weight: 189 lb 3.2 oz (85.8 kg)  Height: 5\' 3"  (1.6 m)   Body mass index is 33.52 kg/m.  Gen: resting comfortably, no acute distress HEENT: no scleral icterus, pupils equal round and reactive, no palptable cervical adenopathy,  CV: RRR, no m/r/g, no jvd Resp: Clear to auscultation bilaterally GI: abdomen  is soft, non-tender, non-distended, normal bowel sounds, no hepatosplenomegaly MSK: extremities are warm, no edema.  Skin: warm, no rash Neuro:  no focal deficits Psych: appropriate affect      Assessment and Plan  1. Chest pain - somewhat atypical symptoms however patient has multiple CAD risk factors including age, HTN, HL, and tobacco history - plan for lexiscan to further evaluate   F/u pending stress results, if normal then can f/u just as needed.     Antoine Poche, M.D.

## 2019-11-21 NOTE — Patient Instructions (Signed)
Your physician recommends that you schedule a follow-up appointment PENDING WITH DR BRANCH  Your physician recommends that you continue on your current medications as directed. Please refer to the Current Medication list given to you today.  Your physician has requested that you have a lexiscan myoview. For further information please visit [email or web address]. Please follow instruction sheet, as given.  Thank you for choosing [facility]!!   

## 2019-11-21 NOTE — Telephone Encounter (Signed)
  Precert needed for:  Lexiscan   Location:   [facility]  Date: December 05, 2019

## 2019-12-05 ENCOUNTER — Other Ambulatory Visit: Payer: Self-pay

## 2019-12-05 ENCOUNTER — Encounter (HOSPITAL_BASED_OUTPATIENT_CLINIC_OR_DEPARTMENT_OTHER)
Admission: RE | Admit: 2019-12-05 | Discharge: 2019-12-05 | Disposition: A | Discharge status: Home / Self Care | Payer: Medicare HMO | Source: Ambulatory Visit | Attending: Cardiology | Admitting: Cardiology

## 2019-12-05 ENCOUNTER — Encounter (HOSPITAL_COMMUNITY)
Admission: RE | Admit: 2019-12-05 | Discharge: 2019-12-05 | Disposition: A | Discharge status: Home / Self Care | Payer: Medicare HMO | Source: Ambulatory Visit | Attending: Dermatology | Admitting: Dermatology

## 2019-12-05 DIAGNOSIS — R079 Chest pain, unspecified: Secondary | ICD-10-CM | POA: Diagnosis present

## 2019-12-05 LAB — NM MYOCAR MULTI W/SPECT W/WALL MOTION / EF
LV dias vol: 55 mL (ref 46–106)
LV sys vol: 14 mL
Peak HR: 75 {beats}/min
RATE: 0.38
Rest HR: 63 {beats}/min
SDS: 2
SRS: 0
SSS: 2
TID: 1.12

## 2019-12-05 MED ORDER — TECHNETIUM TC 99M TETROFOSMIN IV KIT
30.0000 | PACK | Freq: Once | INTRAVENOUS | Status: AC | PRN
  Administered 2019-12-05: 11:00:00 30.6 via INTRAVENOUS

## 2019-12-05 MED ORDER — SODIUM CHLORIDE FLUSH 0.9 % IV SOLN
INTRAVENOUS | Status: AC
  Administered 2019-12-05: 11:00:00 10 mL via INTRAVENOUS
  Filled 2019-12-05: qty 10

## 2019-12-05 MED ORDER — TECHNETIUM TC 99M TETROFOSMIN IV KIT
10.0000 | PACK | Freq: Once | INTRAVENOUS | Status: AC | PRN
  Administered 2019-12-05: 10:00:00 10.7 via INTRAVENOUS

## 2019-12-05 MED ORDER — REGADENOSON 0.4 MG/5ML IV SOLN
INTRAVENOUS | Status: AC
Start: 2019-12-05 — End: 2019-12-05
  Administered 2019-12-05: 11:00:00 0.4 mg via INTRAVENOUS
  Filled 2019-12-05: qty 5

## 2019-12-07 ENCOUNTER — Telehealth: Payer: Self-pay | Admitting: *Deleted

## 2019-12-07 NOTE — Telephone Encounter (Signed)
Pt voiced understanding - routed to pcp  

## 2019-12-07 NOTE — Telephone Encounter (Signed)
-----   Message from Antoine Poche, MD sent at 12/06/2019  9:57 AM EDT ----- Normal stress test, no signs of any blockages in the heart. Needs to f/u with pcp discuss other potential causes of her symptoms, f/u just as needed with [place]  J BrancH MD

## 2021-02-01 LAB — COLOGUARD: COLOGUARD: NEGATIVE

## 2021-09-06 HISTORY — PX: SHOULDER ARTHROSCOPY: SHX128

## 2023-03-30 NOTE — Progress Notes (Unsigned)
Suzanne Blake, female    DOB: 1951-07-28    MRN: [id number]   Brief patient profile:  21  yowf quit smoking 2020 (still vaping sme ) with doe with steps  referred to pulmonary clinic in [facility]  03/31/2023 by Roxine Caddy P for worsening  doe on anoro assoc with dry cough    History of Present Illness  03/31/2023  Pulmonary/ 1st office eval/ Najla Aughenbaugh / [place] [facility]  Chief Complaint  Patient presents with   Establish Care   Shortness of Breath  Dyspnea:  slowed by back problems/ no longer able to do housework or speak s sob  Cough: dry / daytime  Sleep: bed is [place] / 2 pillows  SABA use: helps coughs, uses  about twice daily  02: none  Lung cancer screen: 03/08/23 Centrilobular and paraseptal emphysema with diffuse  bronchial wall thickening.   No obvious day to day or daytime pattern/variability or assoc excess/ purulent sputum or mucus plugs or hemoptysis or cp or chest tightness, subjective wheeze or overt sinus or hb symptoms.   Sleeping ok  without nocturnal  or early am exacerbation  of respiratory  c/o's or need for noct saba. Also denies any obvious fluctuation of symptoms with weather or environmental changes or other aggravating or alleviating factors except as outlined above   No unusual exposure hx or h/o childhood pna/ asthma or knowledge of premature birth.  Current Allergies, Complete Past Medical History, Past Surgical History, Family History, and Social History were reviewed in [employer] record.  ROS  The following are not active complaints unless bolded Hoarseness x one year , sore throat, dysphagia, dental problems, itching, sneezing,  nasal congestion or discharge of excess mucus or purulent secretions, ear ache,   fever, chills, sweats, unintended wt loss or wt gain, classically pleuritic or exertional cp,  orthopnea pnd or arm/hand swelling  or leg swelling, presyncope, palpitations, abdominal pain, anorexia, nausea, vomiting, diarrhea  or  change in bowel habits or change in bladder habits, change in stools or change in urine, dysuria, hematuria,  rash, arthralgias, visual complaints, headache, numbness, weakness or ataxia or problems with walking or coordination,  change in mood or  memory.            Outpatient Medications Prior to Visit  Medication Sig Dispense Refill   albuterol (VENTOLIN HFA) 108 (90 Base) MCG/ACT inhaler Inhale 1-2 puffs into the lungs every 6 (six) hours as needed.     ANORO ELLIPTA 62.5-25 MCG/ACT AEPB Inhale 1 puff into the lungs daily.     CALCIUM CITRATE-VITAMIN D PO Take 1 tablet by mouth daily.      diclofenac (VOLTAREN) 75 MG EC tablet Take 1 tablet (75 mg total) by mouth 2 (two) times daily. Take with food 10 tablet 0   donepezil (ARICEPT) 5 MG tablet Take 5 mg by mouth at bedtime.      FLUoxetine (PROZAC) 40 MG capsule Take 40 mg by mouth daily.     gabapentin (NEURONTIN) 100 MG capsule Take 100 mg by mouth 3 (three) times daily.     glycopyrrolate (ROBINUL) 1 MG tablet Take 1 mg by mouth 2 (two) times daily.     LORazepam (ATIVAN) 0.5 MG tablet Take by mouth every 6 (six) hours as needed.     losartan (COZAAR) 100 MG tablet Take 100 mg by mouth daily.     rosuvastatin (CRESTOR) 20 MG tablet Take 20 mg by mouth daily.  traMADol (ULTRAM) 50 MG tablet Take 50 mg by mouth 3 (three) times daily.      FLUoxetine (PROZAC) 20 MG capsule Take 40 mg by mouth daily.      losartan (COZAAR) 50 MG tablet Take 50 mg by mouth daily.      No facility-administered medications prior to visit.    Past Medical History:  Diagnosis Date   Arthritis    Hyperlipidemia    Hypertension       Objective:     BP (!) 152/82   Pulse 74   Ht 5\' 3"  (1.6 m)   Wt 181 lb (82.1 kg)   SpO2 94%   BMI 32.06 kg/m   SpO2: 94 % RA  Amb hoarse wf nad    HEENT : Oropharynx  clear   Nasal turbinates nl    NECK :  without  apparent JVD/ palpable Nodes/TM    LUNGS: no acc muscle use,  Min barrel  contour  chest wall with bilateral  slightly decreased bs s audible wheeze and  without cough on insp or exp maneuvers and min  Hyperresonant  to  percussion bilaterally    CV:  RRR  no s3 or murmur or increase in P2, and no edema   ABD:  soft and nontender with pos end  insp Hoover's  in the supine position.  No bruits or organomegaly appreciated   MS:  Nl gait/ ext warm without deformities Or obvious joint restrictions  calf tenderness, cyanosis or clubbing     SKIN: warm and dry without lesions    NEURO:  alert, approp, nl sensorium with  no motor or cerebellar deficits apparent.        CT Lung cancer screen: 03/08/23 Centrilobular and paraseptal emphysema with diffuse  bronchial wall thickening.     Assessment   COPD GOLD B Quit smoking 2020 with worsening dry cough x 2023 on anoro maint -LDSCT 03/08/23 Centrilobular and paraseptal emphysema with diffuse bronchial wall thickening.  - 03/31/2023  After extensive coaching inhaler device,  effectiveness =    60% with smi so try spiriva 2.5 samplex x 4 weeks plus gerdotc short term and stop anoro - Allergy screen 03/31/2023 >  Eos 0. 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  Since anoro side effect is on ddx rec change to spiriva 2.5 respimat which is the least likely of the maint inhalers to address her doe and see uacs re cough   F/u in 6 weeks, pfts when available   Upper airway cough syndrome Onset 2023 - d/c anoro, rx short term gerd rx 03/31/2023 >>>   Upper airway cough syndrome (previously labeled PNDS),  is so named because it's frequently impossible to sort out how much is  CR/sinusitis with freq throat clearing (which can be related to primary GERD)   vs  causing  secondary (" extra esophageal")  GERD from wide swings in gastric pressure that occur with throat clearing, often  promoting self use of mint and menthol lozenges that reduce the lower  esophageal sphincter tone and exacerbate the problem further in a cyclical fashion.   These are the same pts (now being labeled as having "irritable larynx syndrome" by some cough centers) who not infrequently have a history of having failed to tolerate ace inhibitors (like lisinopril, as this case) ,  dry powder inhalers(like anoro might be doing  here) or biphosphonates or report having atypical/extraesophageal reflux symptoms that don't respond to standard doses of PPI  and are easily confused as having aecopd or asthma flares by even experienced allergists/ pulmonologists (myself included).   First step is try off anoro and on otc gerd rx, ok to d/c if/when cough subsides   Former cigarette smoker  When respiratory symptoms begin or become refractory well after a patient reports complete smoking cessation,  Especially when this wasn't the case while they were smoking, a red flag is raised based on the work of Dr Primitivo Gauze which states:  if you quit smoking when your best day FEV1 is still well preserved it is highly unlikely you will progress to severe disease.  That is to say, once the smoking stops,  the symptoms should not suddenly erupt or markedly worsen.  If so, the differential diagnosis should include  obesity/deconditioning,  LPR/Reflux/Aspiration syndromes,  occult CHF, or  especially side effect of medications commonly used in this population (paradoxically sometimes the medications intended to help control copd symptoms, which may prove to be the case here but time will tell)    >>> agree with LDSCT f/u as planned per PCP/ reviewed intent with pt and findings on last study   >>> advised against continuing vapes both due to unknown long term lung effects but also as a potential bridge to returning to smoking cigarettes         Each maintenance medication was reviewed in detail including emphasizing most importantly the difference between maintenance and prns and under what  circumstances the prns are to be triggered using an action plan format where appropriate.  Total time for H and P, chart review, counseling, reviewing smi/hfa/dpi device(s) and generating customized AVS unique to this office visit / same day charting  > 45 min with pt new to me          Sandrea Hughs, MD 03/31/2023

## 2023-03-31 ENCOUNTER — Encounter: Payer: Self-pay | Admitting: Internal Medicine

## 2023-03-31 ENCOUNTER — Ambulatory Visit: Payer: Medicare HMO | Admitting: Internal Medicine

## 2023-03-31 VITALS — BP 152/82 | HR 74 | Ht 63.0 in | Wt 181.0 lb

## 2023-03-31 DIAGNOSIS — Z87891 Personal history of nicotine dependence: Secondary | ICD-10-CM | POA: Diagnosis not present

## 2023-03-31 DIAGNOSIS — J449 Chronic obstructive pulmonary disease, unspecified: Secondary | ICD-10-CM | POA: Diagnosis not present

## 2023-03-31 DIAGNOSIS — R058 Other specified cough: Secondary | ICD-10-CM | POA: Insufficient documentation

## 2023-03-31 NOTE — Assessment & Plan Note (Addendum)
Quit smoking 2020 with worsening dry cough x 2023 on anoro maint -LDSCT 03/08/23 Centrilobular and paraseptal emphysema with diffuse bronchial wall thickening.  - 03/31/2023  After extensive coaching inhaler device,  effectiveness =    60% with smi so try spiriva 2.5 samplex x 4 weeks plus gerdotc short term and stop anoro - Allergy screen 03/31/2023 >  Eos 0. 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  Since anoro side effect is on ddx rec change to spiriva 2.5 respimat which is the least likely of the maint inhalers to address her doe and see uacs re cough   F/u in 6 weeks, pfts when available

## 2023-03-31 NOTE — Assessment & Plan Note (Signed)
Onset 2023 - d/c anoro, rx short term gerd rx 03/31/2023 >>>   Upper airway cough syndrome (previously labeled PNDS),  is so named because it's frequently impossible to sort out how much is  CR/sinusitis with freq throat clearing (which can be related to primary GERD)   vs  causing  secondary (" extra esophageal")  GERD from wide swings in gastric pressure that occur with throat clearing, often  promoting self use of mint and menthol lozenges that reduce the lower esophageal sphincter tone and exacerbate the problem further in a cyclical fashion.   These are the same pts (now being labeled as having "irritable larynx syndrome" by some cough centers) who not infrequently have a history of having failed to tolerate ace inhibitors (like lisinopril, as this case) ,  dry powder inhalers(like anoro might be doing here) or biphosphonates or report having atypical/extraesophageal reflux symptoms that don't respond to standard doses of PPI  and are easily confused as having aecopd or asthma flares by even experienced allergists/ pulmonologists (myself included).   First step is try off anoro and on otc gerd rx, ok to d/c if/when cough subsides

## 2023-03-31 NOTE — Patient Instructions (Addendum)
Try prilosec otc 20mg   Take 30-60 min before first meal of the day and Pepcid ac (famotidine) 20 mg one after supper  until cough is completely gone for at least a week without the need for cough suppression  Plan A = Automatic = Always= Spiriva 2.5 mcg   take 2 puffs 1st thing in am  Work on inhaler technique:  relax and gently blow all the way out then take a nice smooth full deep breath back in, triggering the inhaler at same time you start breathing in.  Hold breath in for at least  5 seconds if you can.  Rinse and gargle with water when done.  If mouth or throat bother you at all,  try brushing teeth/gums/tongue with arm and hammer toothpaste/ make a slurry and gargle and spit out.     Plan B = Backup (to supplement plan A, not to replace it) Only use your albuterol inhaler as a rescue medication to be used if you can't catch your breath by resting or doing a relaxed purse lip breathing pattern.  - The less you use it, the better it will work when you need it. - Ok to use the inhaler up to 2 puffs  every 4 hours if you must but call for appointment if use goes up over your usual need - Don't leave home without it !!  (think of it like the starter fluid  for your car)   Please remember to go to the lab department   for your tests - we will call you with the results when they are available.      Please schedule a follow up office visit in 4 weeks, sooner if needed  with all medications /inhalers/ solutions in hand so we can verify exactly what you are taking. This includes all medications from all doctors and over the counters

## 2023-03-31 NOTE — Assessment & Plan Note (Addendum)
  When respiratory symptoms begin or become refractory well after a patient reports complete smoking cessation,  Especially when this wasn't the case while they were smoking, a red flag is raised based on the work of Dr Primitivo Gauze which states:  if you quit smoking when your best day FEV1 is still well preserved it is highly unlikely you will progress to severe disease.  That is to say, once the smoking stops,  the symptoms should not suddenly erupt or markedly worsen.  If so, the differential diagnosis should include  obesity/deconditioning,  LPR/Reflux/Aspiration syndromes,  occult CHF, or  especially side effect of medications commonly used in this population (paradoxically sometimes the medications intended to help control copd symptoms, which may prove to be the case here but time will tell)    >>> agree with LDSCT f/u as planned per PCP/ reviewed intent with pt and findings on last study   >>> advised against continuing vapes both due to unknown long term lung effects but also as a potential bridge to returning to smoking cigarettes         Each maintenance medication was reviewed in detail including emphasizing most importantly the difference between maintenance and prns and under what circumstances the prns are to be triggered using an action plan format where appropriate.  Total time for H and P, chart review, counseling, reviewing smi/hfa/dpi device(s) and generating customized AVS unique to this office visit / same day charting  > 45 min with pt new to me

## 2023-04-27 NOTE — Progress Notes (Unsigned)
Suzanne Blake, female    DOB: Apr 13, 1951    MRN: [id number]   Brief patient profile:  70  yowf quit smoking 2020 (still vaping sme ) with doe with steps  referred to pulmonary clinic in [facility]  03/31/2023 by Roxine Caddy P for worsening  doe on anoro assoc with dry cough    History of Present Illness  03/31/2023  Pulmonary/ 1st office eval/ Miquela Costabile / [facility] [facility]  Chief Complaint  Patient presents with   Establish Care   Shortness of Breath  Dyspnea:  slowed by back problems/ no longer able to do housework or speak s sob  Cough: dry / daytime  Sleep: bed is [place] / 2 pillows  SABA use: helps coughs, uses  about twice daily  02: none  Lung cancer screen: 03/08/23 Centrilobular and paraseptal emphysema with diffuse  bronchial wall thickening.  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   Dyspnea:  *** Cough: *** Sleeping: ***   resp cc  SABA use: *** 02: ***  Lung cancer screening: ***   No obvious day to day or daytime variability or assoc excess/ purulent sputum or mucus plugs or hemoptysis or cp or chest tightness, subjective wheeze or overt sinus or hb symptoms.    Also denies any obvious fluctuation of symptoms with weather or environmental changes or other aggravating or alleviating  factors except as outlined above   No unusual exposure hx or h/o childhood pna/ asthma or knowledge of premature birth.  Current Allergies, Complete Past Medical History, Past Surgical History, Family History, and Social History were reviewed in [employer] record.  ROS  The following are not active complaints unless bolded Hoarseness, sore throat, dysphagia, dental problems, itching, sneezing,  nasal congestion or discharge of excess mucus or purulent secretions, ear ache,   fever, chills, sweats, unintended wt loss or wt gain, classically pleuritic or exertional cp,  orthopnea pnd or arm/hand swelling  or leg swelling, presyncope, palpitations, abdominal pain, anorexia, nausea, vomiting, diarrhea  or change in bowel habits or change in bladder habits, change in stools or change in urine, dysuria, hematuria,  rash, arthralgias, visual complaints, headache, numbness, weakness or ataxia or problems with walking or coordination,  change in mood or  memory.        No outpatient medications have been marked as taking for the 04/28/23 encounter (Appointment) with Nyoka Cowden, MD.           Past Medical History:  Diagnosis Date   Arthritis    Hyperlipidemia    Hypertension       Objective:    Wt Readings from Last 3 Encounters:  03/31/23  181 lb (82.1 kg)  11/21/19 189 lb 3.2 oz (85.8 kg)  01/09/19 180 lb (81.6 kg)      Vital signs reviewed  04/28/2023  - Note at rest 02 sats  ***% on ***   General appearance:    ***       Min bar***          CT Lung cancer screen: 03/08/23 Centrilobular and paraseptal emphysema with diffuse  bronchial wall thickening.     Assessment

## 2023-04-28 ENCOUNTER — Encounter: Payer: Self-pay | Admitting: Internal Medicine

## 2023-04-28 ENCOUNTER — Ambulatory Visit: Payer: Medicare HMO | Admitting: Internal Medicine

## 2023-04-28 VITALS — BP 153/76 | HR 80 | Ht 63.0 in | Wt 180.0 lb

## 2023-04-28 DIAGNOSIS — R058 Other specified cough: Secondary | ICD-10-CM

## 2023-04-28 DIAGNOSIS — J449 Chronic obstructive pulmonary disease, unspecified: Secondary | ICD-10-CM | POA: Diagnosis not present

## 2023-04-28 MED ORDER — GLYCOPYRROLATE 1 MG PO TABS: ORAL_TABLET | ORAL | Status: AC

## 2023-04-28 MED ORDER — PANTOPRAZOLE SODIUM 40 MG PO TBEC: 40.0000 mg | DELAYED_RELEASE_TABLET | Freq: Every day | ORAL | 2 refills | Status: AC

## 2023-04-28 MED ORDER — FAMOTIDINE 20 MG PO TABS: ORAL_TABLET | ORAL | Status: DC

## 2023-04-28 NOTE — Assessment & Plan Note (Addendum)
Quit smoking 2020/MM with worsening dry cough x 2023 on anoro maint -LDSCT 03/08/23 Centrilobular and paraseptal emphysema with diffuse bronchial wall thickening.  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  - 04/28/2023  After extensive coaching inhaler device,  effectiveness =    50% with hfa and dry mouth from lama plus glycopyrolate > rec just use saba prn and leave off lama pending pfts  - 04/28/2023   Walked on RA  x  3  lap(s) =  approx 450   ft  @ mod pace, stopped due to end of study  with lowest 02 sats 95% s sob    Really not convinced she has enough copd to warrant a maint rx at all c/w GROUP A > prn saba should do   dermatitisRe SABA :  I spent extra time with pt today reviewing appropriate use of albuterol for prn use on exertion with the following points: 1) saba is for relief of sob that does not improve by walking a slower pace or resting but rather if the pt does not improve after trying this first. 2) If the pt is convinced, as many are, that saba helps recover from activity faster then it's easy to tell if this is the case by re-challenging : ie stop, take the inhaler, then p 5 minutes try the exact same activity (intensity of workload) that just caused the symptoms and see if they are substantially diminished or not after saba 3) if there is an activity that reproducibly causes the symptoms, try the saba 15 min before the activity on alternate days   If in fact the saba really does help, then fine to continue to use it prn but advised may need to look closer at the maintenance regimen (for now =0) being used to achieve better control of airways disease with exertion.

## 2023-04-28 NOTE — Patient Instructions (Addendum)
Pantoprazole (protonix) 40 mg   Take  30-60 min before first meal of the day and Pepcid (famotidine)  20 mg after supper until return to office - this is the best way to tell whether stomach acid is contributing to your problem.    Stop the anoro and stiolto   Only use your albuterol as a rescue medication to be used if you can't catch your breath by resting or doing a relaxed purse lip breathing pattern.  - The less you use it, the better it will work when you need it. - Ok to use up to 2 puffs  every 4 hours if you must but call for immediate appointment if use goes up over your usual need - Don't leave home without it !!  (think of it like the spare tire for your car)   Also  Ok to try albuterol  x 2 puffs 15 min before an activity (on alternating days)  that you know would usually make you short of breath and see if it makes any difference and if makes none then don't take albuterol after activity unless you can't catch your breath as this means it's the resting that helps, not the albuterol.      Work on inhaler technique:  relax and gently blow all the way out then take a nice smooth full deep breath back in, triggering the inhaler at same time you start breathing in.  Hold breath in for at least  5 seconds if you can.  Rinse and gargle with water when done.  If mouth or throat bother you at all,  try brushing teeth/gums/tongue with arm and hammer toothpaste/ make a slurry and gargle and spit out.   Stop the nystatin   Try taking no more than  half of the  glycopyrolate per day if you must  and your mouth should be better soon.  GERD (REFLUX)  is an extremely common cause of respiratory symptoms just like yours , many times with no obvious heartburn at all.    It can be treated with medication, but also with lifestyle changes including elevation of the head of your bed (ideally with 6-8inch blocks under the headboard of your bed),  Smoking cessation, avoidance of late meals, excessive  alcohol, and avoid fatty foods, chocolate, peppermint, colas, red wine, and acidic juices such as orange juice.  NO MINT OR MENTHOL PRODUCTS SO NO COUGH DROPS  USE SUGARLESS CANDY INSTEAD (Jolley ranchers or Stover's or Life Savers) or even ice chips will also do - the key is to swallow to prevent all throat clearing. NO OIL BASED VITAMINS - use powdered substitutes.  Avoid fish oil when coughing.   My office will be contacting you by phone for referral for PFTs 1st available ([place])   - if you don't hear back from my office within one week please call us back or notify us thru MyChart and we'll address it right away.

## 2023-04-28 NOTE — Assessment & Plan Note (Addendum)
Onset 2023 Allergy screen 03/31/2023 >  Eos 0.2 - d/c anoro, rx short term gerd rx 03/31/2023 >>> some better cough but very dry mouth from anoro/ glyopyrolate so d/c former and minimize latter with continued max gerd rx and  regroup in 6 weeks with ent eval next step    Each maintenance medication was reviewed in detail including emphasizing most importantly the difference between maintenance and prns and under what circumstances the prns are to be triggered using an action plan format where appropriate.  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

## 2023-05-26 ENCOUNTER — Telehealth: Payer: Self-pay | Admitting: Internal Medicine

## 2023-05-26 ENCOUNTER — Other Ambulatory Visit: Payer: Self-pay

## 2023-05-26 DIAGNOSIS — R058 Other specified cough: Secondary | ICD-10-CM

## 2023-05-26 NOTE — Telephone Encounter (Signed)
I do not see and order for ENT and we left a vm for respiratory to call and schedule the pft

## 2023-05-26 NOTE — Telephone Encounter (Signed)
Called and spoke with patient to let her know that we have sent a referral over to ENT,and some from their office will be calling to set up a appt .

## 2023-05-26 NOTE — Telephone Encounter (Signed)
Did you want this patient to have a referral to ENT this was not mention in your note. Please advise

## 2023-05-26 NOTE — Telephone Encounter (Signed)
Patient states when she was here on 04/28/23 and saw Dr. Sherene Sires he mentioned referring her to an ENT---I do not see the referral in the system.  Please advise if this is correct.  She was also inquiring about the ordered PFT, explained to patient we care catching up on our back log and she should hear something soon.  Patient voiced her understanding.

## 2023-05-26 NOTE — Telephone Encounter (Signed)
[facility] ent dx upper airway cough

## 2023-07-19 ENCOUNTER — Institutional Professional Consult (permissible substitution) (INDEPENDENT_AMBULATORY_CARE_PROVIDER_SITE_OTHER): Payer: Medicare HMO

## 2023-08-11 ENCOUNTER — Ambulatory Visit (INDEPENDENT_AMBULATORY_CARE_PROVIDER_SITE_OTHER): Payer: Medicare HMO | Admitting: Otolaryngology

## 2023-08-11 ENCOUNTER — Encounter (INDEPENDENT_AMBULATORY_CARE_PROVIDER_SITE_OTHER): Payer: Self-pay

## 2023-08-11 VITALS — Ht 63.0 in | Wt 180.0 lb

## 2023-08-11 DIAGNOSIS — K219 Gastro-esophageal reflux disease without esophagitis: Secondary | ICD-10-CM | POA: Diagnosis not present

## 2023-08-11 DIAGNOSIS — R053 Chronic cough: Secondary | ICD-10-CM

## 2023-08-11 DIAGNOSIS — J385 Laryngeal spasm: Secondary | ICD-10-CM | POA: Diagnosis not present

## 2023-08-11 DIAGNOSIS — R490 Dysphonia: Secondary | ICD-10-CM

## 2023-08-11 MED ORDER — AZELASTINE HCL 0.1 % NA SOLN: 2.0000 | Freq: Two times a day (BID) | NASAL | 12 refills | Status: AC

## 2023-08-11 MED ORDER — FAMOTIDINE 20 MG PO TABS: ORAL_TABLET | ORAL | Status: AC

## 2023-08-11 NOTE — Patient Instructions (Addendum)
   Reflux Gourmet - use after meals -- reflux or acid medicine

## 2023-08-11 NOTE — Progress Notes (Signed)
Dear Dr. Sherene Sires, Here is my assessment for our mutual patient, Suzanne Blake. Thank you for allowing me the opportunity to care for your patient. Please do not hesitate to contact me should you have any other questions. Sincerely, Dr. Jovita Kussmaul  Otolaryngology Clinic Note Referring provider: Dr. Sherene Sires HPI:  Suzanne Blake is a 72 y.o. female kindly referred by Dr. Sherene Sires for evaluation of dysphonia and LPR and cough.  Initial visit (08/2023): Patient reports: primary complaint today is some burning sensation in her throat and epigastric discomfort ongoing for a few months. Dr. Sherene Sires diagnosed her with reflux and LPR and prescribed prilosec and pepcid (prior) -- did help her throat discomfort until she ran out last week. She also reports that she does have a dry cough as well (dry cough) as Dyspnea on exertion. Dr. Sherene Sires diagnosed her with COPD and UACS, and her shortness of breath is maybe about the same. She reports that she would "run out of breath" while talking or with activity (mostly with activity). Cough is not bothersome to her at all, mucinex clears it up. No PND, but some bilateral mucoid rhinorrhea (intermittent, not assoc with food or other environmental triggers). No triggers for cough or symptoms-- not talking, laughing, perfume, smells, pressure. She also reports that her voice also feels hoarse, off and on, never fully uses it. Talking does make it worse, and at end of the day. Sometimes insufficient for her use. Voice has been tremulous for many years. She is now off Anoro. PFTs pending. She is on gabapentin  Patient otherwise denies: - dysphagia, odynophagia, aspiration episodes or PNA, need for Heimlich, unintentional weight loss - hemoptysis - ear pain, neck masses  GERD on Prilosec and Pepcid  H&N Surgery: no Personal or FHx of bleeding dz or anesthesia difficulty: no   Tobacco: quit smoking 2020  PMHx: COPD, GERD, Mood Dz  Independent Review of Additional Tests or Records:   Allergy screen 03/31/2023 - Eos 0.2 - d/c anoro, rx short term gerd rx 03/31/2023  CT Chest (2024) reviewed   Dr. Thurston Hole pulm notes (03/2023, 04/28/2023): dyspnea, dry cough, SABA helps, no obvious pattern or other sx or sinus sx. No fluctuation with weather changes or environment; mild COPD yet SOB at rest, Rx spiriva, PFTs; UACS - Rx OTC Rx, try off anoro; Query LPR; after this, did have improvement in cough; reduced glycopyrrolate, and Rx GERD PMH/Meds/All/SocHx/FamHx/ROS:   Past Medical History:  Diagnosis Date   Arthritis    Hyperlipidemia    Hypertension      Past Surgical History:  Procedure Laterality Date   DILATION AND CURETTAGE OF UTERUS      History reviewed. No pertinent family history.   Social Connections: Not on file      Current Outpatient Medications:    azelastine (ASTELIN) 0.1 % nasal spray, Place 2 sprays into both nostrils 2 (two) times daily. Use in each nostril as directed, Disp: 30 mL, Rfl: 12   CALCIUM CITRATE-VITAMIN D PO, Take 1 tablet by mouth daily. , Disp: , Rfl:    diclofenac (VOLTAREN) 75 MG EC tablet, Take 1 tablet (75 mg total) by mouth 2 (two) times daily. Take with food, Disp: 10 tablet, Rfl: 0   donepezil (ARICEPT) 5 MG tablet, Take 5 mg by mouth at bedtime. , Disp: , Rfl:    FLUoxetine (PROZAC) 40 MG capsule, Take 40 mg by mouth daily., Disp: , Rfl:    gabapentin (NEURONTIN) 100 MG capsule, Take 100 mg by mouth 3 (  three) times daily., Disp: , Rfl:    glycopyrrolate (ROBINUL) 1 MG tablet, One half daily if absolutely needed, Disp: , Rfl:    LORazepam (ATIVAN) 0.5 MG tablet, Take by mouth every 6 (six) hours as needed., Disp: , Rfl:    losartan (COZAAR) 100 MG tablet, Take 100 mg by mouth daily., Disp: , Rfl:    rosuvastatin (CRESTOR) 20 MG tablet, Take 20 mg by mouth daily. , Disp: , Rfl:    traMADol (ULTRAM) 50 MG tablet, Take 50 mg by mouth 3 (three) times daily. , Disp: , Rfl:    albuterol (VENTOLIN HFA) 108 (90 Base) MCG/ACT inhaler,  Inhale 1-2 puffs into the lungs every 6 (six) hours as needed. (Patient not taking: Reported on 08/11/2023), Disp: , Rfl:    famotidine (PEPCID) 20 MG tablet, One after supper, Disp: , Rfl:    nystatin (MYCOSTATIN) 100000 UNIT/ML suspension, Use as directed 5 mLs in the mouth or throat 4 (four) times daily. (Patient not taking: Reported on 08/11/2023), Disp: , Rfl:    pantoprazole (PROTONIX) 40 MG tablet, Take 1 tablet (40 mg total) by mouth daily. Take 30-60 min before first meal of the day (Patient not taking: Reported on 08/11/2023), Disp: 30 tablet, Rfl: 2   Physical Exam:   Ht 5\' 3"  (1.6 m)   Wt 180 lb (81.6 kg)   BMI 31.89 kg/m   Salient findings:  CN II-XII intact  Bilateral EAC clear and TM intact with well pneumatized middle ear spaces Anterior rhinoscopy: Septum intact; no purulence noted on anterior rhino No lesions of oral cavity/oropharynx No obviously palpable neck masses/lymphadenopathy/thyromegaly No respiratory distress or stridor; voice quality class 2, vocal tremor, not consistent with spasmodic dysphonia; no throat clearing or cough today  Seprately Identifiable Procedures:  Procedure Note Pre-procedure diagnosis:  Dysphonia, LPR, vocal tremor, LPR Post-procedure diagnosis: Same Procedure: Transnasal Fiberoptic Laryngoscopy, CPT 16109 - Mod 25 Indication: same Complications: None apparent EBL: 0 mL Date: 08/27/23   The procedure was undertaken to further evaluate the patient's complaint of dysphonia, LPR, vocal tremor, LPR, with mirror exam inadequate for appropriate examination due to gag reflex and poor patient tolerance  Procedure:  Patient was identified as correct patient. Verbal consent was obtained. The nose was sprayed with oxymetazoline and 4% lidocaine. The The flexible laryngoscope was passed through the nose to view the nasal cavity, pharynx (oropharynx, hypopharynx) and larynx.  The larynx was examined at rest and during multiple phonatory tasks.  Documentation was obtained and reviewed with patient. The scope was removed. The patient tolerated the procedure well.  Findings: The nasal cavity and nasopharynx did not reveal any masses or lesions, mucosa appeared to be without obvious lesions. The tongue base, pharyngeal walls, piriform sinuses, vallecula, epiglottis and postcricoid region are normal in appearance with mild retained pyriform secretions and mild postcricoid edema and erythema. The visualized portion of the subglottis and proximal trachea is widely patent. The vocal folds are mobile bilaterally. There are no lesions on the free edge of the vocal folds nor elsewhere in the larynx worrisome for malignancy.  Mild AP compression suggestive of MTD   Electronically signed by: Read Drivers, MD 08/27/2023 11:27 AM   Impression & Plans:  Diavion Pinder is a 72 y.o. female with:  1. Dysphonia   2. Chronic cough   3. Muscle tension dysphonia   4. Laryngeal spasm   5. Laryngopharyngeal reflux (LPR)    We discussed etiology of her symptoms (cough, dysphonia) and DDX - given  improvement on reflux meds, would suspect LPR/GERD is a cause here. No significant PND but does have some rhinorrhea, so we can trial astelin as well. 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  No structural lesions today or subglottic stenosis of visualized subglottis on exam. We discussed management and reviewed TFL together. Given improvement in her sx, she has decided to proceed with: Continue PPI 2.   Can do short course of Pepcid at bedtime 20mg  3.   We also discussed voice referral given her complaints, and I do think it may help her with feeling of "running out of breath" if vocal mechanics  contributing 4.   Continue pulm f/u/work up 5.   Astelin BID for PND  F/u 3 mos after therapy  See below regarding exact medications prescribed this encounter  including dosages and route: Meds ordered this encounter  Medications   azelastine (ASTELIN) 0.1 % nasal spray    Sig: Place 2 sprays into both nostrils 2 (two) times daily. Use in each nostril as directed    Dispense:  30 mL    Refill:  12   famotidine (PEPCID) 20 MG tablet    Sig: One after supper      Thank you for allowing me the opportunity to care for your patient. Please do not hesitate to contact me should you have any other questions.  Sincerely, Jovita Kussmaul, MD Otolarynoglogist (ENT), [facility] Phone: [phone] Fax: [phone]  08/27/2023, 11:27 AM   I have personally spent 52 minutes involved in face-to-face and non-face-to-face activities for this patient on the day of the visit.  Professional time spent excludes any procedures performed but includes the following activities, in addition to those noted in the documentation: preparing to see the patient (review of outside documentation and results), performing a medically appropriate examination, counseling and educating, ordering medications (pepcid, astelin), referring and communicating with other healthcare professionals, documenting clinical information in the electronic or other health record, independently interpreting results (CT chest and labs) and communicating results with the patient.

## 2024-04-29 NOTE — Progress Notes (Addendum)
 Suzanne Blake, female    DOB: 1950/10/15    MRN: [id number]   Brief patient profile:  53  yowf MM/quit smoking 2020 (still vaping some ) with doe with steps  referred to pulmonary clinic in [facility]  03/31/2023 by Greig Brought P for worsening  doe on anoro assoc with dry cough.    History of Present Illness  03/31/2023  Pulmonary/ 1st office eval/ [facility] / [place] [facility]  Chief Complaint  Patient presents with   Establish Care   Shortness of Breath  Dyspnea:  slowed by back problems/ no longer able to do housework or speak s sob  Cough: dry / daytime  Sleep: bed is Sh[place]ar / 2 pillows  SABA use: helps coughs, uses  about twice daily  02: none  Lung cancer screen: 03/08/23 Centrilobular and paraseptal emphysema with diffuse  bronchial wall thickening.  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 Group B maint on Anoro did not  bring all  meds  Chief Complaint  Patient presents with   COPD    Gold B  Dyspnea:  100 ft  parking lot  Cough: dry hack sporadic/ voice use makes it worse  Sleeping: bed is level 1-2 pillow and breathing cuts off for a year if lower head  SABA use: once a week at most  02: none Lung cancer screening: 03/08/23  Rec Pantoprazole  (protonix ) 40 mg   Take  30-60 min before first meal of the day and Pepcid  (famotidine )  20 mg after  supper until return to office   Stop the anoro and stiolto  Only use your albuterol as a rescue medication  Also  Ok to try albuterol  x 2 puffs 15 min before an activity (on alternating days)  that you know would usually make you short of breath Work on inhaler technique:   Stop the nystatin  Try taking no more than  half of the  glycopyrolate per day if you must  and your mouth should be better soon. GERD diet reviewed, bed blocks rec .    My office will be contacting you by phone for referral for PFTs 1st available ([place])     04/30/2024  f/u ov/[place] office/Getsemani Lindon re: SPN/ COPD Group B    Chief Complaint  Patient presents with   COPD    Hoarsness - mucus - coughing sometimes ABN CT pcp  Dyspnea:  across the house (not reproduced in office)  Cough: min mucoid  Sleeping: fine flat  resp cc  SABA use: 2 h prior  to ov, seems to help a lot  02: none   Lung cancer screening: see SPN dx    No obvious day to day or daytime variability or assoc  purulent sputum  or mucus plugs or hemoptysis or cp or chest tightness, subjective wheeze or overt sinus or hb symptoms.    Also denies any obvious fluctuation of symptoms with weather or environmental changes or other aggravating or alleviating factors except as outlined above   No unusual exposure hx or h/o childhood pna/ asthma or knowledge of premature birth.  Current Allergies, Complete Past Medical History, Past Surgical History, Family History, and Social History were reviewed in [employer] record.  ROS  The following are not active complaints unless bolded Hoarseness, sore throat, dysphagia, dental problems, itching, sneezing,  nasal congestion or discharge of excess mucus or purulent secretions, ear ache,   fever, chills, sweats, unintended wt loss or wt gain, classically pleuritic or exertional cp,  orthopnea pnd or arm/hand swelling  or leg swelling, presyncope, palpitations, abdominal pain, anorexia,  nausea, vomiting, diarrhea  or change in bowel habits or change in bladder habits, change in stools or change in urine, dysuria, hematuria,  rash, arthralgias, visual complaints, headache, numbness, weakness or ataxia or problems with walking or coordination,  change in mood or  memory. Back pain x 5 years spasms radiate to top of R shoulder in last few months        Current Meds  Medication Sig   azelastine  (ASTELIN ) 0.1 % nasal spray Place 2 sprays into both nostrils 2 (two) times daily. Use in each nostril as directed   CALCIUM CITRATE-VITAMIN D PO Take 1 tablet by mouth daily.    diclofenac  (VOLTAREN ) 75 MG EC tablet Take 1 tablet (75 mg total) by mouth 2 (two) times daily. Take with food   donepezil (ARICEPT) 5 MG tablet Take 5 mg by mouth at bedtime.    famotidine  (PEPCID ) 20 MG tablet One after supper   FLUoxetine (PROZAC) 40 MG capsule Take 40 mg by mouth daily.   gabapentin (NEURONTIN) 100 MG capsule Take 100 mg by mouth 3 (three) times daily.   glycopyrrolate  (ROBINUL ) 1 MG tablet One half daily if absolutely needed   LORazepam (ATIVAN) 0.5 MG tablet Take by mouth every 6 (six) hours as needed.   losartan (COZAAR) 100 MG tablet Take 100 mg by mouth daily.   rosuvastatin (CRESTOR) 20 MG tablet Take 20 mg by mouth daily.    traMADol (ULTRAM) 50 MG tablet Take 50 mg by mouth 3 (three) times daily.               Past Medical History:  Diagnosis Date   Arthritis    Hyperlipidemia    Hypertension       Objective:    Wts  04/30/2024       178   04/28/23 180 lb (81.6 kg)  03/31/23 181 lb (82.1 kg)  11/21/19 189 lb 3.2 oz (85.8 kg)     Vital signs reviewed  04/30/2024  - Note at rest 02 sats  94% on RA   General appearance:    elderly wf classic voice fatigue    HEENT : Oropharynx  clear   Nasal turbinates nl    NECK :  without  apparent JVD/ palpable Nodes/TM    LUNGS: no acc muscle use,  Mild barrel  contour chest wall with bilateral  Distant bs s audible wheeze  and  without cough on insp or exp maneuvers  and mild  Hyperresonant  to  percussion bilaterally     CV:  RRR  no s3 or murmur or increase in P2, and no edema   ABD:  soft and nontender with pos end  insp Hoover's  in the supine position.  No bruits or organomegaly appreciated   MS:  Nl gait/ ext warm without deformities Or obvious joint restrictions  calf tenderness, cyanosis or clubbing     SKIN: warm and dry without lesions    NEURO:  alert, approp, nl sensorium with  no motor or cerebellar deficits apparent.        Assessment   Assessment & Plan COPD mixed type (HCC) Quit smoking 2020/MM with worsening dry cough x 2023 on anoro maint -LDSCT 03/08/23 Centrilobular and paraseptal emphysema with diffuse bronchial wall thickening.  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  - 04/28/2023  After extensive coaching inhaler device,  effectiveness =    50% with hfa and dry mouth from lama plus glycopyrolate > rec just use saba prn and leave off lama pending pfts  - 04/28/2023   Walked on RA  x  3  lap(s) =  approx 450   ft  @ mod pace, stopped due to end of study  with lowest 02 sats 95% s sob   - symbicort  80  2bid trial  04/30/2024 >>> - PFTs ordered 04/30/2024  - 04/30/2024   Walked on RA  x  3  lap(s) =  approx 450  ft  @ mod pace, stopped due to end of sutdy  with lowest 02 sats 96% and mild sob last lap   04/30/2024  After extensive coaching inhaler device,  effectiveness =    75% hfa and using saba bid with apparent improvement in doe but due to UACS would avoid high doses of ICS and await pfts before additional empiric trials.  In meantime rec change saba to prn (see AVS)   Discussed in detail all the  indications, usual  risks and alternatives  relative to the benefits with patient who agrees to  proceed with Rx as outlined.       Upper airway cough syndrome Onset 2023 Allergy screen 03/31/2023 >  Eos 0.2 - d/c anoro, rx short term gerd rx 03/31/2023 >>>  - ent referral 05/26/2023 > [facility] eval 08/10/22 rec gerd rx and astelin     Solitary pulmonary nodule on lung CT Quit smoking 2020 - See CT chests  at [facility] Stable slightly nodular left apical pleural-parenchymal scarring. Increase in size of a solid right upper lobe pulmonary nodule with suggestion of spiculation measuring 1.2 cm, previously 0.9 cm.   -  PET scan 04/30/2024 >>>   PET / PFTs should be very helpful in deciding best course of dx/  therapy in this setting   Discussed in detail all the  indications, usual  risks and alternatives  relative to the benefits with patient who agrees to proceed with w/u as outlined.      Each maintenance medication was reviewed in detail including emphasizing most importantly the difference between maintenance and prns and under what circumstances the prns are to be triggered using an action plan format where appropriate.  Total time for H and P, chart review, counseling, reviewing hfa device(s) , directly observing portions of ambulatory 02 saturation study/ and generating customized AVS unique to this office visit / same day charting = 40 min high level decision making and  symptom management                  Patient Instructions  My office will be contacting you by phone for referral to [facility] next available PFTs   and PET next available  - if you don't hear back from my office within one week please call us  back or notify us  thru MyChart and we'll address it right away.    I will call when these studies are available (should be less than a week) to go over them with you and decide the next step.   Automatically, every day : Symbicort  80 Take 2 puffs first thing in am and then another 2 puffs about 12 hours later and use your Pantoprazole  (protonix ) 40 mg   Take  30-60 min before first  meal of the day and Pepcid  (famotidine )  20 mg after supper until return to office   Use your albuterol as a rescue medication to be used if you can't catch your breath by resting, slowing your pace,  or doing a relaxed purse lip breathing pattern.  - The less you use it, the better it will work when you need it. - Ok to use up to 2 puffs  every 4 hours if you must but call for  appointment if use goes up over your usual need - Don't leave home without it !!  (think of it like a spare tire or starter fluid for your car)   Also  Ok to try albuterol 15 min before an activity (on alternating days)  that you know would usually make you short of breath and see if it makes any difference and if makes none then don't take albuterol after activity unless you can't catch your breath as this means it's the resting that helps, not the albuterol.            Ozell America, MD 05/01/2024

## 2024-04-30 ENCOUNTER — Ambulatory Visit: Admitting: Internal Medicine

## 2024-04-30 ENCOUNTER — Encounter: Payer: Self-pay | Admitting: Internal Medicine

## 2024-04-30 VITALS — BP 175/78 | HR 70 | Ht 63.0 in | Wt 178.6 lb

## 2024-04-30 DIAGNOSIS — J449 Chronic obstructive pulmonary disease, unspecified: Secondary | ICD-10-CM | POA: Diagnosis not present

## 2024-04-30 DIAGNOSIS — R911 Solitary pulmonary nodule: Secondary | ICD-10-CM | POA: Diagnosis not present

## 2024-04-30 DIAGNOSIS — R058 Other specified cough: Secondary | ICD-10-CM

## 2024-04-30 MED ORDER — BUDESONIDE-FORMOTEROL FUMARATE 80-4.5 MCG/ACT IN AERO
INHALATION_SPRAY | RESPIRATORY_TRACT | 12 refills | Status: AC
Start: 2024-04-30 — End: ?

## 2024-04-30 NOTE — Patient Instructions (Addendum)
 My office will be contacting you by phone for referral to [facility] next available PFTs   and PET next available  - if you don't hear back from my office within one week please call us  back or notify us  thru MyChart and we'll address it right away.    I will call when these studies are available (should be less than a week) to go over them with you and decide the next step.   Automatically, every day : Symbicort  80 Take 2 puffs first thing in am and then another 2 puffs about 12 hours later and use your Pantoprazole  (protonix ) 40 mg   Take  30-60 min before first meal of the day and Pepcid  (famotidine )  20 mg after supper until return to office   Use your albuterol as a rescue medication to be used if you can't catch your breath by resting, slowing your pace,  or doing a relaxed purse lip breathing pattern.  - The less you use it, the better it will work when you need it. - Ok to use up to 2 puffs  every 4 hours if you must but call for  appointment if use goes up over your usual need - Don't leave home without it !!  (think of it like a spare tire or starter fluid for your car)   Also  Ok to try albuterol 15 min before an activity (on alternating days)  that you know would usually make you short of breath and see if it makes any difference and if makes none then don't take albuterol after activity unless you can't catch your breath as this means it's the resting that helps, not the albuterol.

## 2024-05-01 ENCOUNTER — Telehealth: Payer: Self-pay | Admitting: Internal Medicine

## 2024-05-01 NOTE — Assessment & Plan Note (Addendum)
 Quit smoking 2020/MM with worsening dry cough x 2023 on anoro maint -LDSCT 03/08/23 Centrilobular and paraseptal emphysema with diffuse bronchial wall thickening.  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  - 04/28/2023  After extensive coaching inhaler device,  effectiveness =    50% with hfa and dry mouth from lama plus glycopyrolate > rec just use saba prn and leave off lama pending pfts  - 04/28/2023   Walked on RA  x  3  lap(s) =  approx 450   ft  @ mod pace, stopped due to end of study  with lowest 02 sats 95% s sob   - symbicort  80  2bid trial  04/30/2024 >>> - PFTs ordered 04/30/2024  - 04/30/2024   Walked on RA  x  3  lap(s) =  approx 450  ft  @ mod pace, stopped due to end of sutdy  with lowest 02 sats 96% and mild sob last lap   04/30/2024  After extensive coaching inhaler device,  effectiveness =    75% hfa and using saba bid with apparent improvement in doe but due to UACS would avoid high doses of ICS and await pfts before additional empiric trials.  In meantime rec change saba to prn (see AVS)   Discussed in detail all the  indications, usual  risks and alternatives  relative to the benefits with patient who agrees to proceed with Rx as outlined.

## 2024-05-01 NOTE — Telephone Encounter (Signed)
 Spoke with patient regarding the Thursday 05/10/24 9:30 am PET scan at [facility]  time is 9:00 am-1st floor registration desk for check in--also informed patient of the PFT appointment at [facility] on 07/03/24 at 9:00 am---will mail all information and instructions to patient and she voiced her understanding

## 2024-05-01 NOTE — Assessment & Plan Note (Addendum)
 Quit smoking 2020 - See CT chests  at [facility] Stable slightly nodular left apical pleural-parenchymal scarring. Increase in size of a solid right upper lobe pulmonary nodule with suggestion of spiculation measuring 1.2 cm, previously 0.9 cm.   -  PET scan 04/30/2024 >>>   PET / PFTs should be very helpful in deciding best course of dx/  therapy in this setting   Discussed in detail all the  indications, usual  risks and alternatives  relative to the benefits with patient who agrees to proceed with w/u as outlined.      Each maintenance medication was reviewed in detail including emphasizing most importantly the difference between maintenance and prns and under what circumstances the prns are to be triggered using an action plan format where appropriate.  Total time for H and P, chart review, counseling, reviewing hfa device(s) , directly observing portions of ambulatory 02 saturation study/ and generating customized AVS unique to this office visit / same day charting = 40 min high level decision making and symptom management

## 2024-05-01 NOTE — Assessment & Plan Note (Addendum)
 Onset 2023 Allergy screen 03/31/2023 >  Eos 0.2 - d/c anoro, rx short term gerd rx 03/31/2023 >>>  - ent referral 05/26/2023 > Patel eval 08/10/22 rec gerd rx and astelin 

## 2024-05-10 ENCOUNTER — Ambulatory Visit (HOSPITAL_COMMUNITY)
Admission: RE | Admit: 2024-05-10 | Discharge: 2024-05-10 | Disposition: A | Discharge status: Home / Self Care | Source: Ambulatory Visit | Attending: Internal Medicine | Admitting: Internal Medicine

## 2024-05-10 DIAGNOSIS — R911 Solitary pulmonary nodule: Secondary | ICD-10-CM | POA: Diagnosis present

## 2024-05-10 MED ORDER — FLUDEOXYGLUCOSE F - 18 (FDG) INJECTION
9.2700 | Freq: Once | INTRAVENOUS | Status: AC | PRN
  Administered 2024-05-10: 9.27 via INTRAVENOUS

## 2024-05-13 ENCOUNTER — Ambulatory Visit: Payer: Self-pay | Admitting: Internal Medicine

## 2024-05-15 NOTE — Telephone Encounter (Signed)
 Spoke with patient ---she is scheduled 06/05/24 at 10:00 am with Dr. Catherine.  WIll mail information to patient and she voiced her understanding

## 2024-05-15 NOTE — Progress Notes (Signed)
 Pt will need appt with Dr Catherine in [place] when he is here next.  I relayed results to pt an pt confirmed understanding.

## 2024-05-21 NOTE — Telephone Encounter (Signed)
 Copied from CRM #[id number]. Topic: General - Other >> May 18, 2024  3:03 PM Isabell A wrote: Reason for CRM: Patient is requesting to speak to the front desk directly.   Callback number: [phone]  Patient wanted to confirm her appointment for 07/02/24 with Dr. Algnamin

## 2024-06-05 ENCOUNTER — Ambulatory Visit: Admitting: Pulmonary Disease

## 2024-06-13 ENCOUNTER — Telehealth: Payer: Self-pay

## 2024-06-13 NOTE — Telephone Encounter (Signed)
 Copied from CRM #[id number]. Topic: Clinical - Lab/Test Results >> Jun 11, 2024 10:18 AM Ismael A wrote: Reason for CRM: patient is requesting a call back to go over her PET scan results before she comes in for her appt. on 10/27 - ph# [phone]  Pt called worried about the PET scan of the small nodule that may or may not be cancerous and is wondering if its okay for her to go this long for an appt with dr A, pt worried it might have grown since the scan. Wants your ok if she's okay with waiting that long to see him.

## 2024-06-14 NOTE — Telephone Encounter (Signed)
 Explained the early stage of this disease and not need to rush to bx at this point so she is willing to wait 2 weeks to see interventionalist

## 2024-06-18 ENCOUNTER — Ambulatory Visit: Admitting: Internal Medicine

## 2024-06-29 NOTE — Progress Notes (Signed)
 Established Patient Pulmonology Office Visit   Subjective:  Patient ID: Suzanne Blake, female    DOB: 05-Feb-1951  MRN: [id number]  CC:  Chief Complaint  Patient presents with   COPD    Abn PET  Coughing, sob    HPI  Suzanne Blake is a 73 y/o F with a PMH significant for COPD/Emphysema and GERD who is here for follow up to discuss abnormal chest imaging.  The patient has been undergoing lung cancer screening and was found to have enlarging RUL solitary pulmonary nodule. Dr. Darlean saw patient and recommended PET/CT to better evaluate.  Discussed the use of AI scribe software for clinical note transcription with the patient, who gave verbal consent to proceed.  History of Present Illness Suzanne Blake is a 73 year old female who presents with a lung nodule found on routine screening.  A lung nodule measuring 8 mm was identified on a PET CT scan with slight uptake during her routine annual screening. She has a history of smoking for 30 years, having quit 10 years ago, but currently vapes occasionally. She is concerned about the nodule's potential growth and implications.  She experiences shortness of breath and a cough for the past 10 years. The shortness of breath occurs primarily during activities such as climbing stairs, walking on inclines, or carrying heavy objects. She also notes occasional shortness of breath when lying flat or during extended conversations.  Her past medical history includes shoulder surgery two years ago, during which she experienced difficulty waking from sedation. She also has a history of back issues and arthritis, causing pain in her lower back and shoulders. She uses Voltaren  cream for pain relief.  Family history is significant for cancer on her mother's side, including breast cancer and an unspecified type of cancer, but no known history of lung cancer.  She takes a baby aspirin daily.  Symptoms Associated with Lung cancer:   Central Tumor Sx:  Dyspnea  Peripheral Tumor Sx: Dyspnea  Sx of Metastasis: none  Hx of Anesthesia reactions: Altered Mental Status  PMH:  - COPD/Emphysema - GERD  Important Medications: Baby aspirin  Allergies: Lisinopril  Social History:  Smoking and [employer] Exposure: Tobacco Smoking 30 PY  Family History: History of other types of cancers: Breast cancer  ASA grade:  ASA 2 - Patient with mild systemic disease with no functional limitations  Karnofsky Performance Status: 90 Able to carry on normal activity, minor signs, or symptoms of disease  ECOG Performance Status: (0) Fully active, able to carry on all predisease performance without restriction  ROS    Current Outpatient Medications:    albuterol (VENTOLIN HFA) 108 (90 Base) MCG/ACT inhaler, Inhale 1-2 puffs into the lungs every 6 (six) hours as needed., Disp: , Rfl:    azelastine  (ASTELIN ) 0.1 % nasal spray, Place 2 sprays into both nostrils 2 (two) times daily. Use in each nostril as directed, Disp: 30 mL, Rfl: 12   budesonide -formoterol  (SYMBICORT ) 80-4.5 MCG/ACT inhaler, Take 2 puffs first thing in am and then another 2 puffs about 12 hours later., Disp: 1 each, Rfl: 12   CALCIUM CITRATE-VITAMIN D PO, Take 1 tablet by mouth daily. , Disp: , Rfl:    diclofenac  (VOLTAREN ) 75 MG EC tablet, Take 1 tablet (75 mg total) by mouth 2 (two) times daily. Take with food (Patient taking differently: Pt states this is a cream not tablet), Disp: 10 tablet, Rfl: 0   donepezil (ARICEPT) 5 MG tablet, Take 5 mg by mouth at  bedtime. , Disp: , Rfl:    famotidine  (PEPCID ) 20 MG tablet, One after supper, Disp: , Rfl:    FLUoxetine (PROZAC) 40 MG capsule, Take 40 mg by mouth daily., Disp: , Rfl:    gabapentin (NEURONTIN) 100 MG capsule, Take 100 mg by mouth 3 (three) times daily., Disp: , Rfl:    glycopyrrolate  (ROBINUL ) 1 MG tablet, One half daily if absolutely needed, Disp: , Rfl:    LORazepam (ATIVAN) 0.5 MG tablet, Take by mouth every 6 (six)  hours as needed., Disp: , Rfl:    losartan (COZAAR) 100 MG tablet, Take 100 mg by mouth daily., Disp: , Rfl:    nystatin (MYCOSTATIN) 100000 UNIT/ML suspension, Use as directed 5 mLs in the mouth or throat 4 (four) times daily., Disp: , Rfl:    pantoprazole  (PROTONIX ) 40 MG tablet, Take 1 tablet (40 mg total) by mouth daily. Take 30-60 min before first meal of the day, Disp: 30 tablet, Rfl: 2   rosuvastatin (CRESTOR) 20 MG tablet, Take 20 mg by mouth daily. , Disp: , Rfl:    traMADol (ULTRAM) 50 MG tablet, Take 50 mg by mouth 3 (three) times daily. , Disp: , Rfl:       Objective:  BP (!) 144/80   Pulse 70   Ht 5' 3 (1.6 m)   Wt 181 lb (82.1 kg)   SpO2 94% Comment: ra  BMI 32.06 kg/m  Wt Readings from Last 3 Encounters:  07/02/24 181 lb (82.1 kg)  04/30/24 178 lb 9.6 oz (81 kg)  08/11/23 180 lb (81.6 kg)   BMI Readings from Last 3 Encounters:  07/02/24 32.06 kg/m  04/30/24 31.64 kg/m  08/11/23 31.89 kg/m   SpO2 Readings from Last 3 Encounters:  07/02/24 94%  04/30/24 94%  04/28/23 96%    Physical Exam General: NAD, alert, WD, WN Eyes: PERRL, no scleral icterus ENMT: oropharynx clear, good dentition, no oral lesions, mallampati score III Skin: warm, intact, no rashes Neck: JVD flat, ROM and lymph node assessment normal CV: RRR, no MRG, nl S1 and S2, no peripheral edema Resp: clear to auscultation bilaterally, no wheezes, rales, or rhonchi, normal effort, no clubbing/cyanosis Neuro: Awake alert oriented to person place time and situation  Diagnostic Review:  Last CBC Lab Results  Component Value Date   WBC 6.3 03/31/2023   HGB 13.7 03/31/2023   HCT 41.9 03/31/2023   MCV 100 (H) 03/31/2023   MCH 32.6 03/31/2023   RDW 11.9 03/31/2023   PLT 183 03/31/2023   Last metabolic panel Lab Results  Component Value Date   GLUCOSE 170 (H) 08/07/2010   NA 138 08/07/2010   K 4.3 08/07/2010   CL 109 08/07/2010   CO2 21 08/07/2010   BUN 17 08/07/2010   CREATININE 1.15  08/07/2010   CALCIUM 9.3 08/07/2010   PROT 7.0 08/07/2010   ALBUMIN 3.9 08/07/2010   BILITOT 0.3 08/07/2010   ALKPHOS 45 08/07/2010   AST 26 08/07/2010   ALT 25 08/07/2010    CT chest 03/2024: Interval increase in size of a solid right upper lobe pulmonary nodule with suggestion of spiculation suspicious for malignancy. PET/CT and/or tissue sampling is recommended. LUNG RADS 4B.  PET/CT 05/10/2024:  IMPRESSION: 8 mm right upper lobe pulmonary nodule shows significant FDG uptake for size, and is suspicious for primary bronchogenic carcinoma.   2.2 cm pleural-parenchymal nodular opacity in the left lung apex shows mild FDG uptake for size. This likely represents pleural-parenchymal scarring. Recommend continued attention on  follow-up imaging.   No evidence of thoracic lymph node or distant metastatic disease.    Assessment & Plan:   Assessment & Plan COPD mixed type (HCC)  Solitary pulmonary nodule  Assessment and Plan Assessment & Plan Right lung pulmonary nodule, 8 mm, suspicious for malignancy 8 mm right lung nodule with increased uptake on PET CT, suspicious for malignancy. Differential includes stage 1 lung cancer, potentially curable with surgery or radiation. Biopsy success rate 85%, major adverse effects <3%. - Order detailed CT scan of chest for robotic bronchoscopy mapping. - Schedule robotic bronchoscopy for biopsy on November 6th in [place]. - Instruct to stop baby aspirin two days before procedure. - Advise fasting from midnight before procedure, allow sip of water with morning medications. - Ensure ride home post-procedure due to sedation. - Discuss potential post-procedure side effects: sore throat, increased coughing, blood-tinged phlegm, rare risks of infection, bleeding, pneumothorax. - If biopsy inconclusive, repeat scan in 3-6 months, consider re-biopsy if nodule grows.  Chronic obstructive pulmonary disease (COPD) COPD with 30-year smoking history, quit  10 years ago, current vaping. Symptoms include exertional dyspnea and occasional orthopnea. - Refill albuterol inhaler prescription. - PFTs scheduled on 10/28  Orders Placed This Encounter  Procedures   Procedural/ Surgical Case Request: VIDEO BRONCHOSCOPY WITH ENDOBRONCHIAL NAVIGATION, ENDOBRONCHIAL ULTRASOUND (EBUS)   CT SUPER D CHEST WO CONTRAST   I spent 45 minutes reviewing patient's chart including prior consultant notes, imaging, and PFTs as well as face-to-face with the patient, over half in discussion of the diagnosis and the importance of compliance with the treatment plan.  Return in about 4 weeks (around 07/30/2024).   Margerie Fraiser, MD

## 2024-06-29 NOTE — H&P (View-Only) (Signed)
 Established Patient Pulmonology Office Visit   Subjective:  Patient ID: Suzanne Blake, female    DOB: 05-Feb-1951  MRN: [id number]  CC:  Chief Complaint  Patient presents with   COPD    Abn PET  Coughing, sob    HPI  Suzanne Blake is a 73 y/o F with a PMH significant for COPD/Emphysema and GERD who is here for follow up to discuss abnormal chest imaging.  The patient has been undergoing lung cancer screening and was found to have enlarging RUL solitary pulmonary nodule. Dr. Darlean saw patient and recommended PET/CT to better evaluate.  Discussed the use of AI scribe software for clinical note transcription with the patient, who gave verbal consent to proceed.  History of Present Illness Suzanne Blake is a 73 year old female who presents with a lung nodule found on routine screening.  A lung nodule measuring 8 mm was identified on a PET CT scan with slight uptake during her routine annual screening. She has a history of smoking for 30 years, having quit 10 years ago, but currently vapes occasionally. She is concerned about the nodule's potential growth and implications.  She experiences shortness of breath and a cough for the past 10 years. The shortness of breath occurs primarily during activities such as climbing stairs, walking on inclines, or carrying heavy objects. She also notes occasional shortness of breath when lying flat or during extended conversations.  Her past medical history includes shoulder surgery two years ago, during which she experienced difficulty waking from sedation. She also has a history of back issues and arthritis, causing pain in her lower back and shoulders. She uses Voltaren  cream for pain relief.  Family history is significant for cancer on her mother's side, including breast cancer and an unspecified type of cancer, but no known history of lung cancer.  She takes a baby aspirin daily.  Symptoms Associated with Lung cancer:   Central Tumor Sx:  Dyspnea  Peripheral Tumor Sx: Dyspnea  Sx of Metastasis: none  Hx of Anesthesia reactions: Altered Mental Status  PMH:  - COPD/Emphysema - GERD  Important Medications: Baby aspirin  Allergies: Lisinopril  Social History:  Smoking and [employer] Exposure: Tobacco Smoking 30 PY  Family History: History of other types of cancers: Breast cancer  ASA grade:  ASA 2 - Patient with mild systemic disease with no functional limitations  Karnofsky Performance Status: 90 Able to carry on normal activity, minor signs, or symptoms of disease  ECOG Performance Status: (0) Fully active, able to carry on all predisease performance without restriction  ROS    Current Outpatient Medications:    albuterol (VENTOLIN HFA) 108 (90 Base) MCG/ACT inhaler, Inhale 1-2 puffs into the lungs every 6 (six) hours as needed., Disp: , Rfl:    azelastine  (ASTELIN ) 0.1 % nasal spray, Place 2 sprays into both nostrils 2 (two) times daily. Use in each nostril as directed, Disp: 30 mL, Rfl: 12   budesonide -formoterol  (SYMBICORT ) 80-4.5 MCG/ACT inhaler, Take 2 puffs first thing in am and then another 2 puffs about 12 hours later., Disp: 1 each, Rfl: 12   CALCIUM CITRATE-VITAMIN D PO, Take 1 tablet by mouth daily. , Disp: , Rfl:    diclofenac  (VOLTAREN ) 75 MG EC tablet, Take 1 tablet (75 mg total) by mouth 2 (two) times daily. Take with food (Patient taking differently: Pt states this is a cream not tablet), Disp: 10 tablet, Rfl: 0   donepezil (ARICEPT) 5 MG tablet, Take 5 mg by mouth at  bedtime. , Disp: , Rfl:    famotidine  (PEPCID ) 20 MG tablet, One after supper, Disp: , Rfl:    FLUoxetine (PROZAC) 40 MG capsule, Take 40 mg by mouth daily., Disp: , Rfl:    gabapentin (NEURONTIN) 100 MG capsule, Take 100 mg by mouth 3 (three) times daily., Disp: , Rfl:    glycopyrrolate  (ROBINUL ) 1 MG tablet, One half daily if absolutely needed, Disp: , Rfl:    LORazepam (ATIVAN) 0.5 MG tablet, Take by mouth every 6 (six)  hours as needed., Disp: , Rfl:    losartan (COZAAR) 100 MG tablet, Take 100 mg by mouth daily., Disp: , Rfl:    nystatin (MYCOSTATIN) 100000 UNIT/ML suspension, Use as directed 5 mLs in the mouth or throat 4 (four) times daily., Disp: , Rfl:    pantoprazole  (PROTONIX ) 40 MG tablet, Take 1 tablet (40 mg total) by mouth daily. Take 30-60 min before first meal of the day, Disp: 30 tablet, Rfl: 2   rosuvastatin (CRESTOR) 20 MG tablet, Take 20 mg by mouth daily. , Disp: , Rfl:    traMADol (ULTRAM) 50 MG tablet, Take 50 mg by mouth 3 (three) times daily. , Disp: , Rfl:       Objective:  BP (!) 144/80   Pulse 70   Ht 5' 3 (1.6 m)   Wt 181 lb (82.1 kg)   SpO2 94% Comment: ra  BMI 32.06 kg/m  Wt Readings from Last 3 Encounters:  07/02/24 181 lb (82.1 kg)  04/30/24 178 lb 9.6 oz (81 kg)  08/11/23 180 lb (81.6 kg)   BMI Readings from Last 3 Encounters:  07/02/24 32.06 kg/m  04/30/24 31.64 kg/m  08/11/23 31.89 kg/m   SpO2 Readings from Last 3 Encounters:  07/02/24 94%  04/30/24 94%  04/28/23 96%    Physical Exam General: NAD, alert, WD, WN Eyes: PERRL, no scleral icterus ENMT: oropharynx clear, good dentition, no oral lesions, mallampati score III Skin: warm, intact, no rashes Neck: JVD flat, ROM and lymph node assessment normal CV: RRR, no MRG, nl S1 and S2, no peripheral edema Resp: clear to auscultation bilaterally, no wheezes, rales, or rhonchi, normal effort, no clubbing/cyanosis Neuro: Awake alert oriented to person place time and situation  Diagnostic Review:  Last CBC Lab Results  Component Value Date   WBC 6.3 03/31/2023   HGB 13.7 03/31/2023   HCT 41.9 03/31/2023   MCV 100 (H) 03/31/2023   MCH 32.6 03/31/2023   RDW 11.9 03/31/2023   PLT 183 03/31/2023   Last metabolic panel Lab Results  Component Value Date   GLUCOSE 170 (H) 08/07/2010   NA 138 08/07/2010   K 4.3 08/07/2010   CL 109 08/07/2010   CO2 21 08/07/2010   BUN 17 08/07/2010   CREATININE 1.15  08/07/2010   CALCIUM 9.3 08/07/2010   PROT 7.0 08/07/2010   ALBUMIN 3.9 08/07/2010   BILITOT 0.3 08/07/2010   ALKPHOS 45 08/07/2010   AST 26 08/07/2010   ALT 25 08/07/2010    CT chest 03/2024: Interval increase in size of a solid right upper lobe pulmonary nodule with suggestion of spiculation suspicious for malignancy. PET/CT and/or tissue sampling is recommended. LUNG RADS 4B.  PET/CT 05/10/2024:  IMPRESSION: 8 mm right upper lobe pulmonary nodule shows significant FDG uptake for size, and is suspicious for primary bronchogenic carcinoma.   2.2 cm pleural-parenchymal nodular opacity in the left lung apex shows mild FDG uptake for size. This likely represents pleural-parenchymal scarring. Recommend continued attention on  follow-up imaging.   No evidence of thoracic lymph node or distant metastatic disease.    Assessment & Plan:   Assessment & Plan COPD mixed type (HCC)  Solitary pulmonary nodule  Assessment and Plan Assessment & Plan Right lung pulmonary nodule, 8 mm, suspicious for malignancy 8 mm right lung nodule with increased uptake on PET CT, suspicious for malignancy. Differential includes stage 1 lung cancer, potentially curable with surgery or radiation. Biopsy success rate 85%, major adverse effects <3%. - Order detailed CT scan of chest for robotic bronchoscopy mapping. - Schedule robotic bronchoscopy for biopsy on November 6th in [place]. - Instruct to stop baby aspirin two days before procedure. - Advise fasting from midnight before procedure, allow sip of water with morning medications. - Ensure ride home post-procedure due to sedation. - Discuss potential post-procedure side effects: sore throat, increased coughing, blood-tinged phlegm, rare risks of infection, bleeding, pneumothorax. - If biopsy inconclusive, repeat scan in 3-6 months, consider re-biopsy if nodule grows.  Chronic obstructive pulmonary disease (COPD) COPD with 30-year smoking history, quit  10 years ago, current vaping. Symptoms include exertional dyspnea and occasional orthopnea. - Refill albuterol inhaler prescription. - PFTs scheduled on 10/28  Orders Placed This Encounter  Procedures   Procedural/ Surgical Case Request: VIDEO BRONCHOSCOPY WITH ENDOBRONCHIAL NAVIGATION, ENDOBRONCHIAL ULTRASOUND (EBUS)   CT SUPER D CHEST WO CONTRAST   I spent 45 minutes reviewing patient's chart including prior consultant notes, imaging, and PFTs as well as face-to-face with the patient, over half in discussion of the diagnosis and the importance of compliance with the treatment plan.  Return in about 4 weeks (around 07/30/2024).   Margerie Fraiser, MD

## 2024-07-02 ENCOUNTER — Ambulatory Visit: Admitting: Pulmonary Disease

## 2024-07-02 ENCOUNTER — Encounter: Payer: Self-pay | Admitting: Pulmonary Disease

## 2024-07-02 ENCOUNTER — Telehealth: Payer: Self-pay | Admitting: Pulmonary Disease

## 2024-07-02 VITALS — BP 144/80 | HR 70 | Ht 63.0 in | Wt 181.0 lb

## 2024-07-02 DIAGNOSIS — R911 Solitary pulmonary nodule: Secondary | ICD-10-CM | POA: Insufficient documentation

## 2024-07-02 DIAGNOSIS — R058 Other specified cough: Secondary | ICD-10-CM

## 2024-07-02 DIAGNOSIS — J449 Chronic obstructive pulmonary disease, unspecified: Secondary | ICD-10-CM

## 2024-07-02 NOTE — Patient Instructions (Signed)
  VISIT SUMMARY: During your visit, we discussed the lung nodule found on your recent PET CT scan, your COPD, and your chronic cough. We have planned further tests and procedures to address these issues.  YOUR PLAN: RIGHT LUNG PULMONARY NODULE: An 8 mm nodule in your right lung was found on your PET CT scan, which is suspicious for cancer. -We will order a detailed CT scan of your chest to prepare for a robotic bronchoscopy. -Your robotic bronchoscopy for a biopsy is scheduled for November 6th in [place]. -Stop taking baby aspirin two days before the procedure. -Fast from midnight before the procedure, but you can have a sip of water with your morning medications. -Arrange for someone to drive you home after the procedure due to sedation. -Be aware of potential side effects after the procedure, such as a sore throat, increased coughing, blood-tinged phlegm, and rare risks like infection, bleeding, or a collapsed lung. -If the biopsy results are inconclusive, we may need to repeat the scan in 3-6 months and consider another biopsy if the nodule grows.  CHRONIC OBSTRUCTIVE PULMONARY DISEASE (COPD): You have COPD, likely due to your history of smoking, which causes shortness of breath during activities. -We have refilled your albuterol inhaler prescription.  CHRONIC COUGH: You have had a chronic cough for 10 years, likely related to your COPD. -We will perform a breathing test within the next month to further evaluate your condition.                      Contains text generated by [employer].                                 Contains text generated by [employer].

## 2024-07-02 NOTE — Telephone Encounter (Signed)
 Please schedule the following:  Provider performing procedure: Anthea Udovich Diagnosis: Solitary Pulmonary Nodule Which side if for nodule / mass? RUL Procedure: Navigation bronchoscopy (ION) + EBUS  Has patient been spoken to by Provider and given informed consent? YES Anesthesia: General Do you need Fluro? Yes Duration of procedure: 1.5 hours Date: 11/6 Alternate Date: 11/10  Time: anytime Location: [facility] Does patient have OSA? No DM? No Or Latex allergy? No Medication Restriction/ Anticoagulate/Antiplatelet: Aspirin, stop two days prior to procedure Pre-op Labs Ordered:determined by Anesthesia Imaging request: Needs SUPER D CT asap  (If, SuperDimension CT Chest, please have STAT courier sent to ENDO)

## 2024-07-02 NOTE — Telephone Encounter (Signed)
 Letter finished and given to [facility]. Patient scheduled 07/12/24. Routing to [employer] for [employer]

## 2024-07-03 ENCOUNTER — Ambulatory Visit (HOSPITAL_COMMUNITY)
Admission: RE | Admit: 2024-07-03 | Discharge: 2024-07-03 | Disposition: A | Discharge status: Home / Self Care | Source: Ambulatory Visit | Attending: Internal Medicine | Admitting: Internal Medicine

## 2024-07-03 DIAGNOSIS — R911 Solitary pulmonary nodule: Secondary | ICD-10-CM | POA: Diagnosis present

## 2024-07-03 DIAGNOSIS — R058 Other specified cough: Secondary | ICD-10-CM | POA: Diagnosis present

## 2024-07-03 DIAGNOSIS — J449 Chronic obstructive pulmonary disease, unspecified: Secondary | ICD-10-CM | POA: Insufficient documentation

## 2024-07-03 LAB — PULMONARY FUNCTION TEST
DL/VA % pred: 76 %
DL/VA: 3.18 ml/min/mmHg/L
DLCO unc % pred: 85 %
DLCO unc: 15.69 ml/min/mmHg
FEF 25-75 Post: 0.75 L/s
FEF 25-75 Pre: 0.88 L/s
FEF2575-%Change-Post: -14 %
FEF2575-%Pred-Post: 45 %
FEF2575-%Pred-Pre: 53 %
FEV1-%Change-Post: 0 %
FEV1-%Pred-Post: 88 %
FEV1-%Pred-Pre: 88 %
FEV1-Post: 1.82 L
FEV1-Pre: 1.82 L
FEV1FVC-%Change-Post: 3 %
FEV1FVC-%Pred-Pre: 84 %
FEV6-%Change-Post: -5 %
FEV6-%Pred-Post: 99 %
FEV6-%Pred-Pre: 104 %
FEV6-Post: 2.58 L
FEV6-Pre: 2.72 L
FEV6FVC-%Change-Post: -1 %
FEV6FVC-%Pred-Post: 98 %
FEV6FVC-%Pred-Pre: 99 %
FVC-%Change-Post: -4 %
FVC-%Pred-Post: 100 %
FVC-%Pred-Pre: 105 %
FVC-Post: 2.75 L
FVC-Pre: 2.87 L
Post FEV1/FVC ratio: 66 %
Post FEV6/FVC ratio: 94 %
Pre FEV1/FVC ratio: 63 %
Pre FEV6/FVC Ratio: 95 %
RV % pred: 66 %
RV: 1.48 L
TLC % pred: 88 %
TLC: 4.36 L

## 2024-07-03 MED ORDER — ALBUTEROL SULFATE (2.5 MG/3ML) 0.083% IN NEBU
2.5000 mg | INHALATION_SOLUTION | Freq: Once | RESPIRATORY_TRACT | Status: AC
  Administered 2024-07-03: 2.5 mg via RESPIRATORY_TRACT

## 2024-07-05 ENCOUNTER — Telehealth: Payer: Self-pay | Admitting: Internal Medicine

## 2024-07-08 NOTE — Telephone Encounter (Signed)
 canceled

## 2024-07-09 ENCOUNTER — Ambulatory Visit (HOSPITAL_COMMUNITY)
Admission: RE | Admit: 2024-07-09 | Discharge: 2024-07-09 | Disposition: A | Discharge status: Home / Self Care | Source: Ambulatory Visit | Attending: Pulmonary Disease | Admitting: Pulmonary Disease

## 2024-07-09 DIAGNOSIS — R911 Solitary pulmonary nodule: Secondary | ICD-10-CM | POA: Diagnosis present

## 2024-07-10 ENCOUNTER — Other Ambulatory Visit: Payer: Self-pay

## 2024-07-10 ENCOUNTER — Encounter (HOSPITAL_COMMUNITY): Payer: Self-pay | Admitting: Pulmonary Disease

## 2024-07-10 NOTE — Pre-Procedure Instructions (Signed)
-------------    SDW INSTRUCTIONS given:  Your procedure is scheduled on 07/12/24.  Report to [facility] Main Entrance A at 6:45 A.M., and check in at the Admitting office.  Any questions or running late day of surgery: call [phone]    Remember:  Do not eat or drink after midnight the night before your surgery    Take these medicines the morning of surgery with A SIP OF WATER  azelastine  (ASTELIN )  budesonide -formoterol  (SYMBICORT   FLUoxetine (PROZAC)  gabapentin (NEURONTIN)  pantoprazole  (PROTONIX )  rosuvastatin (CRESTOR)    May take these medicines IF NEEDED: albuterol (VENTOLIN HFA)- bring inhaler LORazepam (ATIVAN)  traMADol (ULTRAM)     As of today, STOP taking any Aspirin (unless otherwise instructed by your surgeon) Aleve, Naproxen, Ibuprofen, Motrin, Advil, Goody's, BC's, all herbal medications, fish oil, and all vitamins.   Do NOT Smoke (Tobacco/Vaping) 24 hours prior to your procedure  If you use a CPAP at night, you may bring all equipment for your overnight stay.     You will be asked to remove any contacts, glasses, piercing's, hearing aid's, dentures/partials prior to surgery. Please bring cases for these items if needed.     Patients discharged the day of surgery will not be allowed to drive home, and someone needs to stay with them for 24 hours.  SURGICAL WAITING ROOM VISITATION Patients may have no more than 2 support people in the waiting area - these visitors may rotate.   Pre-op nurse will coordinate an appropriate time for 1 ADULT support person, who may not rotate, to accompany patient in pre-op.  Children under the age of 6 must have an adult with them who is not the patient and must remain in the main waiting area with an adult.  If the patient needs to stay at the hospital during part of their recovery, the visitor guidelines for inpatient rooms apply.  Please refer to the [facility] website for the visitor guidelines for any additional  information.   Special instructions:   Suzanne Blake- Preparing For Surgery   Please follow these instructions carefully.   Shower the NIGHT BEFORE SURGERY and the MORNING OF SURGERY with DIAL Soap.   Pat yourself dry with a CLEAN TOWEL.  Wear CLEAN PAJAMAS to bed the night before surgery  Place CLEAN SHEETS on your bed the night of your first shower and DO NOT SLEEP WITH PETS.   Additional instructions for the day of surgery: DO NOT APPLY any lotions, deodorants, cologne, or perfumes.   Do not wear jewelry or makeup Do not wear nail polish, gel polish, artificial nails, or any other type of covering on natural nails (fingers and toes) Do not bring valuables to the hospital. [facility] is not responsible for valuables/personal belongings. Put on clean/comfortable clothes.  Please brush your teeth.  Ask your nurse before applying any prescription medications to the skin.

## 2024-07-10 NOTE — Progress Notes (Signed)
 PCP - Amy Body, PA-C Cardiologist - denies  PPM/ICD - denies  Chest x-ray - denies EKG - DOS Stress Test - 12/06/19 ECHO - denies Cardiac Cath - denies  CPAP - denies  DM- denies  ASA/Blood Thinner Instructions: n/a   ERAS Protcol - no, NPO  COVID TEST- n/a  Anesthesia review: n/a  Patient verbally denies any shortness of breath, fever, cough and chest pain during phone call     Questions were answered. Patient verbalized understanding of instructions.

## 2024-07-11 NOTE — Anesthesia Preprocedure Evaluation (Signed)
 Anesthesia Evaluation  Patient identified by MRN, date of birth, ID band Patient awake    Reviewed: Allergy & Precautions, NPO status , Patient's Chart, lab work & pertinent test results  Airway Mallampati: II  TM Distance: >3 FB Neck ROM: Full    Dental  (+) Dental Advisory Given, Partial Upper, Partial Lower, Poor Dentition, Chipped, Missing   Pulmonary shortness of breath and with exertion, COPD, Patient abstained from smoking., former smoker   Pulmonary exam normal breath sounds clear to auscultation       Cardiovascular hypertension,  Rhythm:Regular Rate:Normal     Neuro/Psych  Headaches PSYCHIATRIC DISORDERS Anxiety Depression   Dementia    GI/Hepatic Neg liver ROS,GERD  Medicated and Controlled,,  Endo/Other  negative endocrine ROS    Renal/GU Renal disease     Musculoskeletal  (+) Arthritis ,    Abdominal  (+) + obese  Peds  Hematology negative hematology ROS (+)   Anesthesia Other Findings   Reproductive/Obstetrics                              Anesthesia Physical Anesthesia Plan  ASA: 3  Anesthesia Plan: General   Post-op Pain Management: Minimal or no pain anticipated   Induction: Intravenous  PONV Risk Score and Plan: 3 and Ondansetron, Dexamethasone and Treatment may vary due to age or medical condition  Airway Management Planned: Oral ETT  Additional Equipment:   Intra-op Plan:   Post-operative Plan: Extubation in OR  Informed Consent: I have reviewed the patients History and Physical, chart, labs and discussed the procedure including the risks, benefits and alternatives for the proposed anesthesia with the patient or authorized representative who has indicated his/her understanding and acceptance.     Dental advisory given  Plan Discussed with: CRNA  Anesthesia Plan Comments:          Anesthesia Quick Evaluation

## 2024-07-12 ENCOUNTER — Ambulatory Visit: Payer: Self-pay | Admitting: Pulmonary Disease

## 2024-07-12 ENCOUNTER — Ambulatory Visit (HOSPITAL_COMMUNITY)

## 2024-07-12 ENCOUNTER — Ambulatory Visit (HOSPITAL_BASED_OUTPATIENT_CLINIC_OR_DEPARTMENT_OTHER): Payer: Self-pay | Admitting: Anesthesiology

## 2024-07-12 ENCOUNTER — Ambulatory Visit (HOSPITAL_COMMUNITY): Payer: Self-pay | Admitting: Anesthesiology

## 2024-07-12 ENCOUNTER — Encounter (HOSPITAL_COMMUNITY): Admission: RE | Disposition: A | Payer: Self-pay | Source: Home / Self Care | Attending: Pulmonary Disease

## 2024-07-12 ENCOUNTER — Ambulatory Visit (HOSPITAL_COMMUNITY)
Admission: RE | Admit: 2024-07-12 | Discharge: 2024-07-12 | Disposition: A | Discharge status: Home / Self Care | Attending: Pulmonary Disease | Admitting: Pulmonary Disease

## 2024-07-12 ENCOUNTER — Encounter (HOSPITAL_COMMUNITY): Payer: Self-pay | Admitting: Pulmonary Disease

## 2024-07-12 ENCOUNTER — Other Ambulatory Visit: Payer: Self-pay

## 2024-07-12 DIAGNOSIS — R911 Solitary pulmonary nodule: Secondary | ICD-10-CM | POA: Insufficient documentation

## 2024-07-12 DIAGNOSIS — F17290 Nicotine dependence, other tobacco product, uncomplicated: Secondary | ICD-10-CM | POA: Insufficient documentation

## 2024-07-12 DIAGNOSIS — I10 Essential (primary) hypertension: Secondary | ICD-10-CM | POA: Insufficient documentation

## 2024-07-12 DIAGNOSIS — F418 Other specified anxiety disorders: Secondary | ICD-10-CM | POA: Insufficient documentation

## 2024-07-12 DIAGNOSIS — K219 Gastro-esophageal reflux disease without esophagitis: Secondary | ICD-10-CM | POA: Insufficient documentation

## 2024-07-12 DIAGNOSIS — J449 Chronic obstructive pulmonary disease, unspecified: Secondary | ICD-10-CM

## 2024-07-12 DIAGNOSIS — F0390 Unspecified dementia without behavioral disturbance: Secondary | ICD-10-CM | POA: Insufficient documentation

## 2024-07-12 DIAGNOSIS — J439 Emphysema, unspecified: Secondary | ICD-10-CM | POA: Insufficient documentation

## 2024-07-12 DIAGNOSIS — M19012 Primary osteoarthritis, left shoulder: Secondary | ICD-10-CM | POA: Insufficient documentation

## 2024-07-12 DIAGNOSIS — M19011 Primary osteoarthritis, right shoulder: Secondary | ICD-10-CM | POA: Diagnosis not present

## 2024-07-12 DIAGNOSIS — Z7982 Long term (current) use of aspirin: Secondary | ICD-10-CM | POA: Diagnosis not present

## 2024-07-12 DIAGNOSIS — Z87891 Personal history of nicotine dependence: Secondary | ICD-10-CM | POA: Diagnosis not present

## 2024-07-12 HISTORY — PX: VIDEO BRONCHOSCOPY WITH ENDOBRONCHIAL ULTRASOUND: SHX6177

## 2024-07-12 HISTORY — DX: Chronic obstructive pulmonary disease, unspecified: J44.9

## 2024-07-12 HISTORY — DX: Dyspnea, unspecified: R06.00

## 2024-07-12 HISTORY — DX: Gastro-esophageal reflux disease without esophagitis: K21.9

## 2024-07-12 HISTORY — DX: Anxiety disorder, unspecified: F41.9

## 2024-07-12 HISTORY — PX: VIDEO BRONCHOSCOPY WITH ENDOBRONCHIAL NAVIGATION: SHX6175

## 2024-07-12 HISTORY — DX: Headache, unspecified: R51.9

## 2024-07-12 HISTORY — DX: Other complications of anesthesia, initial encounter: T88.59XA

## 2024-07-12 LAB — BASIC METABOLIC PANEL WITH GFR
Anion gap: 17 — ABNORMAL HIGH (ref 5–15)
BUN: 15 mg/dL (ref 8–23)
CO2: 21 mmol/L — ABNORMAL LOW (ref 22–32)
Calcium: 9.5 mg/dL (ref 8.9–10.3)
Chloride: 102 mmol/L (ref 98–111)
Creatinine, Ser: 1.04 mg/dL — ABNORMAL HIGH (ref 0.44–1.00)
GFR, Estimated: 56 mL/min — ABNORMAL LOW (ref >60)
Glucose, Bld: 121 mg/dL — ABNORMAL HIGH (ref 70–99)
Potassium: 4.4 mmol/L (ref 3.5–5.1)
Sodium: 140 mmol/L (ref 135–145)

## 2024-07-12 LAB — CBC
HCT: 40.6 % (ref 36.0–46.0)
Hemoglobin: 13.1 g/dL (ref 12.0–15.0)
MCH: 32.4 pg (ref 26.0–34.0)
MCHC: 32.3 g/dL (ref 30.0–36.0)
MCV: 100.5 fL — ABNORMAL HIGH (ref 80.0–100.0)
Platelets: 124 K/uL — ABNORMAL LOW (ref 150–400)
RBC: 4.04 MIL/uL (ref 3.87–5.11)
RDW: 12.2 % (ref 11.5–15.5)
WBC: 5.4 K/uL (ref 4.0–10.5)
nRBC: 0 % (ref 0.0–0.2)

## 2024-07-12 SURGERY — VIDEO BRONCHOSCOPY WITH ENDOBRONCHIAL NAVIGATION
Anesthesia: General | Laterality: Right

## 2024-07-12 MED ORDER — FENTANYL CITRATE (PF) 100 MCG/2ML IJ SOLN
INTRAMUSCULAR | Status: AC
  Filled 2024-07-12: qty 2

## 2024-07-12 MED ORDER — PROPOFOL 10 MG/ML IV BOLUS
INTRAVENOUS | Status: DC | PRN
  Administered 2024-07-12 (×2): 20 mg via INTRAVENOUS
  Administered 2024-07-12: 110 mg via INTRAVENOUS
  Administered 2024-07-12 (×2): 20 mg via INTRAVENOUS

## 2024-07-12 MED ORDER — ROCURONIUM BROMIDE 10 MG/ML (PF) SYRINGE
PREFILLED_SYRINGE | INTRAVENOUS | Status: DC | PRN
Start: 2024-07-12 — End: 2024-07-12
  Administered 2024-07-12: 10 mg via INTRAVENOUS
  Administered 2024-07-12: 40 mg via INTRAVENOUS
  Administered 2024-07-12 (×2): 10 mg via INTRAVENOUS

## 2024-07-12 MED ORDER — SUGAMMADEX SODIUM 200 MG/2ML IV SOLN
INTRAVENOUS | Status: DC | PRN
  Administered 2024-07-12: 200 mg via INTRAVENOUS

## 2024-07-12 MED ORDER — ONDANSETRON HCL 4 MG/2ML IJ SOLN
INTRAMUSCULAR | Status: DC | PRN
Start: 2024-07-12 — End: 2024-07-12
  Administered 2024-07-12: 4 mg via INTRAVENOUS

## 2024-07-12 MED ORDER — PROPOFOL 500 MG/50ML IV EMUL
INTRAVENOUS | Status: DC | PRN
  Administered 2024-07-12: 40 ug/kg/min via INTRAVENOUS

## 2024-07-12 MED ORDER — ALBUTEROL SULFATE HFA 108 (90 BASE) MCG/ACT IN AERS
INHALATION_SPRAY | RESPIRATORY_TRACT | Status: DC | PRN
  Administered 2024-07-12: 4 via RESPIRATORY_TRACT

## 2024-07-12 MED ORDER — FENTANYL CITRATE (PF) 250 MCG/5ML IJ SOLN
INTRAMUSCULAR | Status: DC | PRN
  Administered 2024-07-12 (×2): 50 ug via INTRAVENOUS

## 2024-07-12 MED ORDER — LIDOCAINE HCL (CARDIAC) PF 100 MG/5ML IV SOSY
PREFILLED_SYRINGE | INTRAVENOUS | Status: DC | PRN
  Administered 2024-07-12: 80 mg via INTRATRACHEAL

## 2024-07-12 MED ORDER — LACTATED RINGERS IV SOLN: INTRAVENOUS | Status: DC

## 2024-07-12 MED ORDER — CHLORHEXIDINE GLUCONATE 0.12 % MT SOLN
15.0000 mL | Freq: Once | OROMUCOSAL | Status: AC
  Administered 2024-07-12: 15 mL via OROMUCOSAL
  Filled 2024-07-12: qty 15

## 2024-07-12 SURGICAL SUPPLY — 1 items: superlock fiducial marker IMPLANT

## 2024-07-12 NOTE — Transfer of Care (Signed)
 Immediate Anesthesia Transfer of Care Note  Patient: Suzanne Blake  Procedure(s) Performed: VIDEO BRONCHOSCOPY WITH ENDOBRONCHIAL NAVIGATION (Right) BRONCHOSCOPY, WITH EBUS (Bilateral)  Patient Location: PACU  Anesthesia Type:General  Level of Consciousness: awake  Airway & Oxygen Therapy: Patient connected to face mask oxygen  Post-op Assessment: Report given to RN  Post vital signs: stable  Last Vitals:  Vitals Value Taken Time  BP 153/65 07/12/24 11:00  Temp    Pulse 62 07/12/24 11:01  Resp 19 07/12/24 11:01  SpO2 97 % 07/12/24 11:01  Vitals shown include unfiled device data.  Last Pain:  Vitals:   07/12/24 0734  TempSrc:   PainSc: 8       Patients Stated Pain Goal: 2 (07/12/24 0734)  Complications: No notable events documented.

## 2024-07-12 NOTE — Discharge Instructions (Signed)
 Flexible Bronchoscopy, Care After This sheet gives you information about how to care for yourself after your test. Your doctor may also give you more specific instructions. If you have problems or questions, contact your doctor. Follow these instructions at home: Eating and drinking When you are wide awake, your numbness is gone and your cough and gag reflexes have come back, you may: Start eating only soft foods. Slowly drink liquids. Six hours after the test, go back to your normal diet. Driving Do not drive for 24 hours if you were given a medicine to help you relax (sedative). Do not drive or use heavy machinery while taking prescription pain medicine. General instructions Take over-the-counter and prescription medicines only as told by your doctor. Return to your normal activities as told. Ask what activities are safe for you. Do not use any products that have nicotine or tobacco in them. This includes cigarettes and e-cigarettes. If you need help quitting, ask your doctor. Keep all follow-up visits as told by your doctor. This is important. It is very important if you had a tissue sample (biopsy) taken. Get help right away if: You have shortness of breath that gets worse. You get light-headed. You feel like you are going to pass out (faint). You have chest pain. You cough up: More than a little blood. More blood than before. Summary Do not use cigarettes. Do not use e-cigarettes. Seek care in the Emergency Department right away if you have chest pain or shortness of breath. Call or MyChart Message our office for any questions or problems at [phone].    This information is not intended to replace advice given to you by your health care provider. Make sure you discuss any questions you have with your health care provider.    Can resume aspirin 81 mg daily on 07/13/2024

## 2024-07-12 NOTE — Interval H&P Note (Signed)
 History and Physical Interval Note:  07/12/2024 7:26 AM  Suzanne Blake  has presented today for surgery, with the diagnosis of solitary pulmonary nodue.  The various methods of treatment have been discussed with the patient and family. After consideration of risks, benefits and other options for treatment, the patient has consented to  Procedure(s): VIDEO BRONCHOSCOPY WITH ENDOBRONCHIAL NAVIGATION (Right) BRONCHOSCOPY, WITH EBUS (Bilateral) as a surgical intervention.  The patient's history has been reviewed, patient examined, no change in status, stable for surgery.  I have reviewed the patient's chart and labs.  Questions were answered to the patient's satisfaction.     [name]

## 2024-07-12 NOTE — Anesthesia Procedure Notes (Signed)
 Procedure Name: Intubation Date/Time: 07/12/2024 9:37 AM  Performed by: Claudene Florina Boga, CRNAPre-anesthesia Checklist: Patient identified, Emergency Drugs available, Suction available and Patient being monitored Patient Re-evaluated:Patient Re-evaluated prior to induction Oxygen Delivery Method: Circle System Utilized Preoxygenation: Pre-oxygenation with 100% oxygen Induction Type: IV induction Ventilation: Mask ventilation without difficulty Laryngoscope Size: Mac and 3 Grade View: Grade I Tube type: Oral Tube size: 8.5 mm Number of attempts: 1 Airway Equipment and Method: Stylet Placement Confirmation: ETT inserted through vocal cords under direct vision, positive ETCO2 and breath sounds checked- equal and bilateral Secured at: 21 cm Tube secured with: Tape Dental Injury: Teeth and Oropharynx as per pre-operative assessment

## 2024-07-12 NOTE — Op Note (Signed)
 Video Bronchoscopy with Endobronchial Ultrasound and Electromagnetic Navigation Procedure Note  Date of Operation: 07/12/2024  Pre-op Diagnosis: RUL Solitary Pulmonary Nodule  Post-op Diagnosis: Same  Surgeon: Paula Southerly, MD  Assistants: N/A  Anesthesia: General endotracheal anesthesia  Operation: Flexible video fiberoptic bronchoscopy with endobronchial ultrasound, robotic assisted navigation and biopsies.  Estimated Blood Loss: Minimal  Complications: None, CXR pending at this time.  Indications and History: Suzanne Blake is a 73 y.o. female with hx of COPD and RUL solitary pulmonary nodule.  Recommendation was made to achieve a tissue diagnosis using endobronchial ultrasound and robotic assisted navigational bronchoscopy. The risks, benefits, complications, treatment options and expected outcomes were discussed with the patient.  The possibilities of pneumothorax, pneumonia, reaction to medication, pulmonary aspiration, perforation of a viscus, bleeding, failure to diagnose a condition and creating a complication requiring transfusion or operation were discussed with the patient who freely signed the consent.    Description of Procedure: The patient was seen in the Preoperative Area, was examined and was deemed appropriate to proceed.  The patient was taken to [facility] room 3, identified as Suzanne Blake and the procedure verified as Flexible Video Fiberoptic Bronchoscopy with robotic assisted navigation and endobronchial ultrasound.  A Time Out was held and the above information confirmed.   Robotic assisted navigation: Prior to the date of the procedure a high-resolution CT scan of the chest was performed. Utilizing ION software program a virtual tracheobronchial tree was generated to allow the creation of distinct navigation pathways to the patient's parenchymal abnormalities. After being taken to the operating room general anesthesia was initiated and the patient  was orally  intubated. The video fiberoptic bronchoscope was introduced via the endotracheal tube and a general inspection was performed which showed normal right and left lung anatomy. Aspiration of the bilateral mainstems was completed to remove any remaining secretions. Robotic catheter inserted into patient's endotracheal tube.   Target #1 RUL Solitary Pulmonary Nodule: The distinct navigation pathways prepared prior to this procedure were then utilized to navigate to patient's lesion identified on CT scan. The robotic catheter was secured into place and the vision probe was withdrawn.  Lesion location was approximated using fluoroscopy.  Local registration and targeting was performed using [employer] Cios mobile C-arm three-dimensional imaging. Under fluoroscopic guidance transbronchial needle brushings, transbronchial needle biopsies, and transbronchial cryoprobe biopsies were performed to be sent for cytology and pathology.  Needle-in-lesion was confirmed using Cios mobile C-arm.  A bronchioalveolar lavage was performed in the RUL and sent for microbiology.  Under fluoroscopic guidance a single fiducial marker was placed adjacent to the nodule.  Endobronchial ultrasound: The robotic scope was then withdrawn and the endobronchial ultrasound was used to identify and characterize the peritracheal, hilar and bronchial lymph nodes. Inspection showed < 5 mm 11 L, 4L, 7, 4R which were not sampled. 11 R was enlarged at 1 cm and TBNA was performed, 4 passes. Using real-time ultrasound guidance Wang needle biopsies were take from Station 11R nodes and were sent for cytology.   At the end of the procedure a general airway inspection was performed and there was no evidence of active bleeding. The bronchoscope was removed.  The patient tolerated the procedure well. There was no significant blood loss and there were no obvious complications. A post-procedural chest x-ray is pending.  Samples Target #1: 1.  Transbronchial needle brushings from RUL SPN 2. Transbronchial Wang needle biopsies from RUL SPN 3. Transbronchial cryoprobe biopsies from RUL SPN 4. Bronchoalveolar lavage from RUL  SPN 5. Fiducial left at RUL SPN  EBUS Samples: 1. Wang needle biopsies from 11R node, 4 passes.  Paula Southerly, MD [place] [facility]

## 2024-07-12 NOTE — Telephone Encounter (Signed)
 Discussed results with pt per dr Catherine

## 2024-07-12 NOTE — Op Note (Signed)
 Procedure Note  Patient: Suzanne Blake  [employer] Cios mobile C-arm was utilized to identify and biopsy RUL Solitary Pulmonary Nodule.  Needle-in-lesion was confirmed using real-time Cios imaging, and images were uploaded to PACS.     Paula Southerly, MD [place] [facility]

## 2024-07-13 ENCOUNTER — Ambulatory Visit: Payer: Self-pay | Admitting: Pulmonary Disease

## 2024-07-13 LAB — CYTOLOGY - NON PAP

## 2024-07-13 NOTE — Anesthesia Postprocedure Evaluation (Signed)
 Anesthesia Post Note  Patient: Suzanne Blake  Procedure(s) Performed: VIDEO BRONCHOSCOPY WITH ENDOBRONCHIAL NAVIGATION (Right) BRONCHOSCOPY, WITH EBUS (Bilateral)     Patient location during evaluation: PACU Anesthesia Type: General Level of consciousness: sedated and patient cooperative Pain management: pain level controlled Vital Signs Assessment: post-procedure vital signs reviewed and stable Respiratory status: spontaneous breathing Cardiovascular status: stable Anesthetic complications: no   No notable events documented.  Last Vitals:  Vitals:   07/12/24 1130 07/12/24 1145  BP: (!) 151/64 (!) 153/85  Pulse: 60 65  Resp: 17 (!) 21  Temp: 37.3 C   SpO2: 93% 92%    Last Pain:  Vitals:   07/12/24 1130  TempSrc:   PainSc: 0-No pain                 Norleen Pope

## 2024-07-13 NOTE — Telephone Encounter (Signed)
 FYI Only or Action Required?: FYI only for provider: monitor with strict ED precautions.  Patient is followed in Pulmonology for COPD, last seen on 07/02/2024 by Suzanne Cools, MD.  Called Nurse Triage reporting Cough.  Symptoms began yesterday.  Interventions attempted: Nothing.  Symptoms are: stable.  Triage Disposition: See PCP When Office is Open (Within 3 Days)  Patient/caregiver understands and will follow disposition?: Yes  E2C2 Pulmonary Triage - Initial Assessment Questions "Chief Complaint (e.g., cough, sob, wheezing, fever, chills, sweat or additional symptoms) *Go to specific symptom protocol after initial questions. Blood flecks on tissue s/p bronch 11/6  "How long have symptoms been present?" 1 day  Have you tested for COVID or Flu? Note: If not, ask patient if a home test can be taken. If so, instruct patient to call back for positive results. No  MEDICINES:   "Have you used any OTC meds to help with symptoms?" No If yes, ask "What medications?" NA  "Have you used your inhalers/maintenance medication?" No If yes, "What medications?" NA  If inhaler, ask "How many puffs and how often?" Note: Review instructions on medication in the chart. NA  OXYGEN: "Do you wear supplemental oxygen?" No If yes, "How many liters are you supposed to use?" NA  "Do you monitor your oxygen levels?" No If yes, What is your reading (oxygen level) today? NA  What is your usual oxygen saturation reading?  (Note: Pulmonary O2 sats should be 90% or greater) na   Copied from CRM #[id number]. Topic: Clinical - Red Word Triage >> Jul 13, 2024  5:39 PM Suzanne Blake wrote: [facility] that prompted transfer to Nurse Triage: Bronchoscopy on yesterday morning.  Coughing up phlem and blood today worse than yesterday. Reason for Disposition  [1] Coughed up blood-tinged sputum AND [2] more than once  Answer Assessment - Initial Assessment Questions Patient with Bronch yesterday. Had  some blood on her tissue yesterday and today. Former smoker and was vaping for a time to. States her phlegm is darker like normal but just a few blood spots. Nothing worse than yesterday. Denies fever, SOB, chest pain, dizziness. Had restrarted her ASA today per d/c instructions. Was off 4days prior to Bronch. Strict ED/UC precautions given and understood.  1. ONSET: When did the cough begin?      Ongoing  2. SEVERITY: How bad is the cough today?      moderate 3. SPUTUM: Describe the color of your sputum (e.g., none, dry cough; clear, [place], yellow, green)     Brown/blood tinged- S/P Bronch 4. HEMOPTYSIS: Are you coughing up any blood? If Yes, ask: How much? (e.g., flecks, streaks, tablespoons, etc.)     Flecks of blood on the tissue. Green gunky mucous 5. DIFFICULTY BREATHING: Are you having difficulty breathing? If Yes, ask: How bad is it? (e.g., mild, moderate, severe)      denies 6. FEVER: Do you have a fever? If Yes, ask: What is your temperature, how was it measured, and when did it start?     denies 7. CARDIAC HISTORY: Do you have any history of heart disease? (e.g., heart attack, congestive heart failure)      denies 8. LUNG HISTORY: Do you have any history of lung disease?  (e.g., pulmonary embolus, asthma, emphysema)     COPD 9. PE RISK FACTORS: Do you have a history of blood clots? (or: recent major surgery, recent prolonged travel, bedridden)     denies 10. OTHER SYMPTOMS: Do you have any other symptoms? (e.g.,  runny nose, wheezing, chest pain)       Denies SOB, CP, Wheezing  Protocols used: Cough - Acute Productive-A-AH

## 2024-07-14 LAB — ACID FAST SMEAR (AFB, MYCOBACTERIA): Acid Fast Smear: NEGATIVE

## 2024-07-16 LAB — CYTOLOGY - NON PAP

## 2024-07-16 NOTE — Telephone Encounter (Signed)
 Bronchoscopy on yesterday morning. Coughing up phlem and blood today worse than yesterday.   Please advise

## 2024-07-16 NOTE — Telephone Encounter (Signed)
 I called patient to inquire about her symptoms. She notes that today the blood has improved. She notes frequency of having blood in her phlegm is improved. She also notes that it is only blood tinged sputum. She has no chest pain or shortness of breath. I told her it is expected to have some blood in phlegm with bronchoscopy and that to call us  back if patient develops chest pain, SOB, or more hemoptysis. The patient agrees.

## 2024-07-17 ENCOUNTER — Ambulatory Visit: Payer: Self-pay | Admitting: Pulmonary Disease

## 2024-07-17 LAB — AEROBIC/ANAEROBIC CULTURE W GRAM STAIN (SURGICAL/DEEP WOUND)
Culture: NORMAL
Gram Stain: NONE SEEN

## 2024-07-24 LAB — FUNGUS CULTURE WITH STAIN

## 2024-07-24 LAB — FUNGAL ORGANISM REFLEX

## 2024-07-24 LAB — FUNGUS CULTURE RESULT

## 2024-07-29 NOTE — Progress Notes (Unsigned)
 Established Patient Pulmonology Office Visit   Subjective:  Patient ID: Suzanne Blake, female    DOB: [birth year]  MRN: [id number]  CC: No chief complaint on file.   HPI  Ms. Beckmann is a 73 y/o F with a PMH significant for COPD/Emphysema and GERD who is here for post bronchoscopy follow up.  She was last seen on 07/02/2024 for RUL solitary pulmonary nodule. I recommended proceeding with navigation bronchoscopy. This was performed on 07/12/2024. Cytology came back negative.  ***  {PULM QUESTIONNAIRES (Optional):33196}  ROS  {History (Optional):23778}  Current Outpatient Medications:    albuterol  (VENTOLIN  HFA) 108 (90 Base) MCG/ACT inhaler, Inhale 1-2 puffs into the lungs every 6 (six) hours as needed., Disp: , Rfl:    aspirin EC 81 MG tablet, Take 81 mg by mouth daily. Swallow whole., Disp: , Rfl:    azelastine  (ASTELIN ) 0.1 % nasal spray, Place 2 sprays into both nostrils 2 (two) times daily. Use in each nostril as directed, Disp: 30 mL, Rfl: 12   budesonide -formoterol  (SYMBICORT ) 80-4.5 MCG/ACT inhaler, Take 2 puffs first thing in am and then another 2 puffs about 12 hours later., Disp: 1 each, Rfl: 12   CALCIUM CITRATE-VITAMIN D PO, Take 1 tablet by mouth daily. , Disp: , Rfl:    donepezil (ARICEPT) 5 MG tablet, Take 5 mg by mouth at bedtime. , Disp: , Rfl:    famotidine  (PEPCID ) 20 MG tablet, One after supper, Disp: , Rfl:    FLUoxetine (PROZAC) 40 MG capsule, Take 40 mg by mouth daily., Disp: , Rfl:    gabapentin (NEURONTIN) 100 MG capsule, Take 100 mg by mouth 3 (three) times daily., Disp: , Rfl:    glycopyrrolate  (ROBINUL ) 1 MG tablet, One half daily if absolutely needed, Disp: , Rfl:    LORazepam (ATIVAN) 0.5 MG tablet, Take by mouth every 6 (six) hours as needed., Disp: , Rfl:    losartan (COZAAR) 100 MG tablet, Take 100 mg by mouth daily., Disp: , Rfl:    nystatin (MYCOSTATIN) 100000 UNIT/ML suspension, Use as directed 5 mLs in the mouth or throat 4 (four) times daily.,  Disp: , Rfl:    pantoprazole  (PROTONIX ) 40 MG tablet, Take 1 tablet (40 mg total) by mouth daily. Take 30-60 min before first meal of the day, Disp: 30 tablet, Rfl: 2   rosuvastatin (CRESTOR) 20 MG tablet, Take 20 mg by mouth daily. , Disp: , Rfl:    traMADol (ULTRAM) 50 MG tablet, Take 50 mg by mouth 3 (three) times daily. , Disp: , Rfl:       Objective:  There were no vitals taken for this visit. {Pulm Vitals (Optional):32837}  Physical Exam   Diagnostic Review:  {Labs (Optional):32838}   CT chest 03/2024: Interval increase in size of a solid right upper lobe pulmonary nodule with suggestion of spiculation suspicious for malignancy. PET/CT and/or tissue sampling is recommended. LUNG RADS 4B.   PET/CT 05/10/2024:  IMPRESSION: 8 mm right upper lobe pulmonary nodule shows significant FDG uptake for size, and is suspicious for primary bronchogenic carcinoma.   2.2 cm pleural-parenchymal nodular opacity in the left lung apex shows mild FDG uptake for size. This likely represents pleural-parenchymal scarring. Recommend continued attention on follow-up imaging.   No evidence of thoracic lymph node or distant metastatic disease.  CT super D 07/09/2024: IMPRESSION: 1. 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 2. Probable asymmetric nodular scarring in the apex  of the left upper lobe. Recommend continued attention on follow-up as low-grade adenocarcinoma cannot be excluded. 3. Additional scattered small pulmonary nodules. Recommend attention on follow-up. Liver appears cirrhotic. 4. Small bilateral adrenal adenomas. 5. Aortic atherosclerosis (ICD10-I70.0). Coronary artery calcification. 6. Emphysema (ICD10-J43.9)  Cytology: FINAL MICROSCOPIC DIAGNOSIS:  D. LYMPH NODE, 11R, FINE NEEDLE ASPIRATION:  - No malignant cells identified  - Lymphoid tissue present   FINAL MICROSCOPIC  DIAGNOSIS:  A. LUNG, RUL, FINE NEEDLE ASPIRATION  BIOPSY:  -  No malignant cells identified  B. LUNG, RUL, BRUSHING:  - Nondiagnostic material     Assessment & Plan:   Assessment & Plan COPD mixed type (HCC)  Solitary pulmonary nodule   No orders of the defined types were placed in this encounter.     No follow-ups on file.   Mavrik Bynum, MD

## 2024-07-30 ENCOUNTER — Ambulatory Visit: Admitting: Pulmonary Disease

## 2024-07-30 ENCOUNTER — Encounter: Payer: Self-pay | Admitting: Pulmonary Disease

## 2024-07-30 VITALS — BP 163/74 | HR 74 | Ht 63.0 in | Wt 180.6 lb

## 2024-07-30 DIAGNOSIS — Z23 Encounter for immunization: Secondary | ICD-10-CM

## 2024-07-30 DIAGNOSIS — J449 Chronic obstructive pulmonary disease, unspecified: Secondary | ICD-10-CM

## 2024-07-30 DIAGNOSIS — R911 Solitary pulmonary nodule: Secondary | ICD-10-CM

## 2024-07-30 NOTE — Patient Instructions (Signed)
  VISIT SUMMARY: You had a follow-up visit after your bronchoscopy procedure. The biopsy was negative for lung cancer, but a fungal culture identified Penicillium mold. We discussed your chest pain, back pain, and occasional phlegm production. You were referred to a specialist for further evaluation and treatment of the mold exposure.  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 This might be due to mold exposure at home. -You are referred to an infectious disease specialist for evaluation and treatment of the Penicillium mold. -Blood tests have been ordered to check for any immune system problems. -A follow-up CT scan will be scheduled in 3 to 6 months to monitor the nodule growth.  CHRONIC OBSTRUCTIVE PULMONARY DISEASE (COPD): You have occasional phlegm production, which is managed with your current medications. -Continue using your current medications to manage phlegm production.  GENERAL HEALTH MAINTENANCE: You have not yet received your flu shot this year. -Please get your flu shot as soon as possible.  Contains text generated by [employer].

## 2024-07-30 NOTE — Assessment & Plan Note (Signed)
 Solitary pulmonary nodule with penicillium growth Biopsy negative for lung cancer. Fungal culture showed penicillium, unusual without immunodeficiency. Possible mold exposure at home. Monitoring nodule growth over time. - Referred to infectious disease specialist for penicillium evaluation and treatment. I'm not sure if this is colonization versus infection. On review of literature, there are a few case reports of masses and nodules caused by penicillium species. I will defer to ID physician for potential treatment of penicillium - Ordered blood tests for immunodeficiency. - Schedule follow-up CT scan in 3 to 6 months to monitor nodule growth.

## 2024-07-30 NOTE — Assessment & Plan Note (Signed)
 MMRC < 2, CAT < 10. Class A. Continue Albuterol  MDI as needed.

## 2024-07-31 ENCOUNTER — Ambulatory Visit: Payer: Self-pay | Admitting: Pulmonary Disease

## 2024-07-31 LAB — HIV ANTIBODY (ROUTINE TESTING W REFLEX): HIV Screen 4th Generation wRfx: NONREACTIVE

## 2024-08-01 LAB — COLOGUARD: COLOGUARD: NEGATIVE

## 2024-08-16 ENCOUNTER — Other Ambulatory Visit: Payer: Self-pay

## 2024-08-16 ENCOUNTER — Encounter: Payer: Self-pay | Admitting: Internal Medicine

## 2024-08-16 ENCOUNTER — Ambulatory Visit: Admitting: Internal Medicine

## 2024-08-16 VITALS — BP 179/92 | HR 72 | Temp 98.1°F | Ht 63.0 in | Wt 185.0 lb

## 2024-08-16 DIAGNOSIS — R911 Solitary pulmonary nodule: Secondary | ICD-10-CM

## 2024-08-16 DIAGNOSIS — F17290 Nicotine dependence, other tobacco product, uncomplicated: Secondary | ICD-10-CM | POA: Diagnosis not present

## 2024-08-16 NOTE — Progress Notes (Unsigned)
 Patient: Suzanne Blake  DOB: 1951/07/23 MRN: [id number] PCP: Jolee Greig DEL, PA-C    Chief Complaint  Patient presents with   New Patient (Initial Visit)     Patient Active Problem List   Diagnosis Date Noted   Solitary pulmonary nodule 07/02/2024   Solitary pulmonary nodule on lung CT 04/30/2024   COPD GOLD 1 /AB 03/31/2023   Upper airway cough syndrome 03/31/2023   Former cigarette smoker 03/31/2023   Arthralgia 10/31/2019   Dysphagia 10/31/2019   Acute recurrent maxillary sinusitis 09/21/2018   Hyperkalemia 03/31/2018   Cough 03/10/2018   Mixed hyperlipidemia 11/23/2017   Hypercalcemia 07/26/2017   Unspecified dementia, mild, without behavioral disturbance, psychotic disturbance, mood disturbance, and anxiety (HCC) 11/23/2016   Stage 3 chronic kidney disease (HCC) 11/23/2016   Trochanteric bursitis, left hip 03/17/2015   Low back pain 04/17/2014   Major depressive disorder, single episode, moderate (HCC) 03/01/2014   Osteoarthrosis 03/01/2014   Other abnormal glucose 03/01/2014   Pure hypercholesterolemia 02/19/2014   Plantar fascial fibromatosis 05/22/2013   Acquired trigger finger 07/25/2012     Subjective:  Suzanne Blake is a 73 y.o. female with a history of COPD/emphysema, GERD, found to have right upper lobe solitary pulmonary nodule, former cigarette smoker, low back pain, MDD, ulcerative arthrosis, trigger finger, CKD stage III followed by pulmonology referred to ID given bronchoscopy showed penicillium species.  She had a CT chest as part of lung cancer screening and found to have right upper lobe solitary enlarging nodule.  She has a history of smoking for 30 years, quit about 3010 years ago but currently vapes occasionally. Last seen on 10/20//25 and underwent CT on 07/09/2024 showed spiculated anterior segment right upper lobe nodule hypermetabolic on 9//25  stage I primary bronchogenic ca. Underwent bronch on 07/12/24 with FNA of RUL nodule not showing malignancy.  Funfal cx + penicillium sp. Referred to ID Today; Pt notes no respiratory complaints, she states inhalor control copd. Pt on room air. SH edoes not have hemoptysis.  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  No outside [place]  travel.  ROS  Past Medical History:  Diagnosis Date   Anxiety    Arthritis    Complication of anesthesia    Difficult to wake up after shoulder surgery per patient   COPD (chronic obstructive pulmonary disease) (HCC)    Dyspnea    GERD (gastroesophageal reflux disease)    Headache    Hyperlipidemia    Hypertension     Outpatient Medications Prior to Visit  Medication Sig Dispense Refill   albuterol  (VENTOLIN  HFA) 108 (90 Base) MCG/ACT inhaler Inhale 1-2 puffs into the lungs every 6 (six) hours as needed.     aspirin EC 81 MG tablet Take 81 mg by mouth daily. Swallow whole.     azelastine  (ASTELIN ) 0.1 % nasal spray Place 2 sprays into both nostrils 2 (two) times daily. Use in each nostril as directed 30 mL 12   budesonide -formoterol  (SYMBICORT ) 80-4.5 MCG/ACT inhaler Take 2 puffs first thing in am and then another 2 puffs about 12 hours later. 1 each 12   CALCIUM CITRATE-VITAMIN D PO Take 1 tablet by mouth daily.      donepezil (ARICEPT) 5 MG tablet Take 5 mg by mouth at bedtime.      famotidine  (PEPCID ) 20 MG tablet One after supper     FLUoxetine (PROZAC) 40 MG capsule Take 40 mg by mouth daily.  gabapentin (NEURONTIN) 100 MG capsule Take 100 mg by mouth 3 (three) times daily.     glycopyrrolate  (ROBINUL ) 1 MG tablet One half daily if absolutely needed     LORazepam (ATIVAN) 0.5 MG tablet Take by mouth every 6 (six) hours as needed.     losartan (COZAAR) 100 MG tablet Take 100 mg by mouth daily.     nystatin (MYCOSTATIN) 100000 UNIT/ML suspension Use as directed 5 mLs in the mouth or throat 4 (four) times daily.     pantoprazole  (PROTONIX ) 40 MG tablet Take 1 tablet (40 mg total) by mouth daily. Take 30-60 min before  first meal of the day 30 tablet 2   rosuvastatin (CRESTOR) 20 MG tablet Take 20 mg by mouth daily.      traMADol (ULTRAM) 50 MG tablet Take 50 mg by mouth 3 (three) times daily.      No facility-administered medications prior to visit.     Allergies[1]  Social History[2]  No family history on file.  Objective:   Vitals:   08/16/24 0929  BP: (!) 179/92  Pulse: 72  Temp: 98.1 F (36.7 C)  TempSrc: Oral  SpO2: 96%  Weight: 185 lb (83.9 kg)  Height: 5' 3 (1.6 m)   Body mass index is 32.77 kg/m.  Physical Exam  Lab Results: Lab Results  Component Value Date   WBC 5.4 07/12/2024   HGB 13.1 07/12/2024   HCT 40.6 07/12/2024   MCV 100.5 (H) 07/12/2024   PLT 124 (L) 07/12/2024    Lab Results  Component Value Date   CREATININE 1.04 (H) 07/12/2024   BUN 15 07/12/2024   NA 140 07/12/2024   K 4.4 07/12/2024   CL 102 07/12/2024   CO2 21 (L) 07/12/2024      Assessment & Plan:   Fungitell -stop vaping -F.u ct in 3 months(11/23/24) -F/u end of marrch itraconazole capsules (200 mg each time, twice a day, taken immediately after meals) for antifungal therapy (day 12) without the above-mentioned antibiotics.   Loney Stank, MD [facility]   08/16/2024  9:36 AM     [1]  Allergies Allergen Reactions   Lisinopril Anaphylaxis  [2]  Social History Tobacco Use   Smoking status: Former    Current packs/day: 0.00    Types: Cigarettes, E-cigarettes    Quit date: 2015    Years since quitting: 10.9   Smokeless tobacco: Never   Tobacco comments:    Smokes vapes sometimes   Vaping Use   Vaping status: Some Days   Substances: Nicotine  Substance Use Topics   Alcohol  use: No   Drug use: No

## 2024-08-19 LAB — FUNGITELL (1-3)-B-D-GLUCAN
(1-3)-B-D-Glucan: <31 pg/mL (ref <60)
Interpretation: NEGATIVE

## 2024-08-19 LAB — ASPERGILLUS ANTIGEN,SERUM
Aspergillus Ag, EIA: NOT DETECTED
Index Value: 0.04 (ref <0.50)

## 2024-08-27 LAB — ACID FAST CULTURE WITH REFLEXED SENSITIVITIES (MYCOBACTERIA): Acid Fast Culture: NEGATIVE

## 2024-09-18 ENCOUNTER — Other Ambulatory Visit (HOSPITAL_COMMUNITY): Payer: Self-pay | Admitting: General Practice

## 2024-09-18 DIAGNOSIS — G8929 Other chronic pain: Secondary | ICD-10-CM

## 2024-09-26 ENCOUNTER — Ambulatory Visit (HOSPITAL_COMMUNITY)
Admission: RE | Admit: 2024-09-26 | Discharge: 2024-09-26 | Disposition: A | Discharge status: Home / Self Care | Source: Ambulatory Visit | Attending: General Practice | Admitting: General Practice

## 2024-09-26 DIAGNOSIS — G8929 Other chronic pain: Secondary | ICD-10-CM | POA: Insufficient documentation

## 2024-11-23 ENCOUNTER — Other Ambulatory Visit (HOSPITAL_COMMUNITY)

## 2024-11-26 ENCOUNTER — Ambulatory Visit: Payer: Self-pay | Admitting: Internal Medicine

## 2024-12-11 ENCOUNTER — Ambulatory Visit: Admitting: Pulmonary Disease
